# Patient Record
Sex: Female | Born: 1990 | Hispanic: No | Smoking: Current every day smoker
Health system: Southern US, Community
[De-identification: ages and names within clinical notes are randomized; demographics above are authoritative.]

## PROBLEM LIST (undated history)

## (undated) ENCOUNTER — Inpatient Hospital Stay (HOSPITAL_COMMUNITY): Payer: Self-pay

## (undated) DIAGNOSIS — Z789 Other specified health status: Secondary | ICD-10-CM

## (undated) DIAGNOSIS — O99320 Drug use complicating pregnancy, unspecified trimester: Secondary | ICD-10-CM

## (undated) HISTORY — DX: Other specified health status: Z78.9

## (undated) HISTORY — PX: NO PAST SURGERIES: SHX2092

---

## 2017-07-26 NOTE — L&D Delivery Note (Signed)
Patient is a 27 y.o. now G1P1 s/p NSVD at [redacted]w[redacted]d, who was admitted for SROM @0300  on 06/19/18.  She progressed with augmentation (pitocin) to complete and pushed 2hr 66minutes to deliver.  Cord clamping delayed by several minutes then clamped by CNM and cut by FOB.  Placenta intact and spontaneous, bleeding minimal. Mom and baby stable prior to transfer to postpartum. She plans on breastfeeding and formula feeding. She is unsure of method for birth control.  Delivery Note At 1:33 AM a viable and healthy female was delivered via Vaginal, Spontaneous (Presentation: OA ).  APGAR: 8, 9; weight pending .   Placenta intact and spontaneous, bleeding minimal. 3V Cord:  with no complications:   Anesthesia:  Epidural Episiotomy: None Lacerations: None Suture Repair: None Est. Blood Loss (mL): 51  Mom to postpartum.  Baby to Couplet care / Skin to Skin.  Lajean Manes CNM  06/20/2018, 2:39 AM

## 2017-11-04 ENCOUNTER — Other Ambulatory Visit: Payer: Self-pay

## 2017-11-04 ENCOUNTER — Encounter (HOSPITAL_BASED_OUTPATIENT_CLINIC_OR_DEPARTMENT_OTHER): Payer: Self-pay

## 2017-11-04 ENCOUNTER — Emergency Department (HOSPITAL_BASED_OUTPATIENT_CLINIC_OR_DEPARTMENT_OTHER): Payer: Medicaid Other

## 2017-11-04 ENCOUNTER — Emergency Department (HOSPITAL_BASED_OUTPATIENT_CLINIC_OR_DEPARTMENT_OTHER)
Admission: EM | Admit: 2017-11-04 | Discharge: 2017-11-04 | Disposition: A | Discharge status: Home / Self Care | Payer: Medicaid Other | Attending: Emergency Medicine | Admitting: Emergency Medicine

## 2017-11-04 DIAGNOSIS — Z5321 Procedure and treatment not carried out due to patient leaving prior to being seen by health care provider: Secondary | ICD-10-CM | POA: Insufficient documentation

## 2017-11-04 DIAGNOSIS — N939 Abnormal uterine and vaginal bleeding, unspecified: Secondary | ICD-10-CM | POA: Diagnosis present

## 2017-11-04 LAB — URINALYSIS, ROUTINE W REFLEX MICROSCOPIC
BILIRUBIN URINE: NEGATIVE
Glucose, UA: NEGATIVE mg/dL
KETONES UR: 15 mg/dL — AB
LEUKOCYTES UA: NEGATIVE
NITRITE: NEGATIVE
Protein, ur: NEGATIVE mg/dL
Specific Gravity, Urine: >1.03 — ABNORMAL HIGH (ref 1.005–1.030)
pH: 6 (ref 5.0–8.0)

## 2017-11-04 LAB — PREGNANCY, URINE: PREG TEST UR: POSITIVE — AB

## 2017-11-04 LAB — URINALYSIS, MICROSCOPIC (REFLEX)

## 2017-11-04 NOTE — ED Notes (Addendum)
Pt just  returned from [place] , pts sister states pt  is not staying as patient is walking down hallway to exit.  Pt would not stop to speak with nurse

## 2017-11-04 NOTE — ED Notes (Signed)
Patient transported to Ultrasound 

## 2017-11-04 NOTE — ED Provider Notes (Signed)
Patient presented for vaginal bleeding in early pregnancy.  I ordered a pelvic US as soon as the patient ws roomed as the tech was leaving in 15-20 min and I wanted to make sure the patient had the most important study done. The patient had the [place], but eloped from the ER as soon as she was brought back to her room and PT my evaluation.  I had no direct contact with this patient during her ED visit. I did review her US findings and I do not see any emergent issues such as ectopic pregnancy.     Margarita Mail, PA-C 11/04/17 2358    Blanchie Dessert, MD 11/05/17 [phone]

## 2017-11-04 NOTE — ED Triage Notes (Signed)
C/o vaginal bleeding since 4pm-1 pad-states she had pos preg test at [facility] in [place] she is approx [redacted] weeks pregnant-NAD-steady gait

## 2017-11-24 ENCOUNTER — Other Ambulatory Visit (HOSPITAL_COMMUNITY): Payer: Self-pay | Admitting: Family

## 2017-11-24 DIAGNOSIS — Z369 Encounter for antenatal screening, unspecified: Secondary | ICD-10-CM

## 2017-11-24 LAB — OB RESULTS CONSOLE HEPATITIS B SURFACE ANTIGEN: HEP B S AG: NEGATIVE

## 2017-11-24 LAB — OB RESULTS CONSOLE ABO/RH: RH TYPE: POSITIVE

## 2017-11-24 LAB — OB RESULTS CONSOLE RPR: RPR: NONREACTIVE

## 2017-11-24 LAB — OB RESULTS CONSOLE HGB/HCT, BLOOD
HEMATOCRIT: 36
Hemoglobin: 11.7

## 2017-11-24 LAB — OB RESULTS CONSOLE RUBELLA ANTIBODY, IGM: Rubella: IMMUNE

## 2017-11-24 LAB — OB RESULTS CONSOLE GC/CHLAMYDIA: CHLAMYDIA, DNA PROBE: POSITIVE

## 2017-11-24 LAB — OB RESULTS CONSOLE VARICELLA ZOSTER ANTIBODY, IGG: Varicella: IMMUNE

## 2017-11-24 LAB — OB RESULTS CONSOLE PLATELET COUNT: Platelets: 286

## 2017-11-28 ENCOUNTER — Other Ambulatory Visit: Payer: Self-pay | Admitting: Family

## 2017-11-28 DIAGNOSIS — N6320 Unspecified lump in the left breast, unspecified quadrant: Secondary | ICD-10-CM

## 2017-12-08 ENCOUNTER — Encounter: Payer: Self-pay | Admitting: *Deleted

## 2017-12-13 ENCOUNTER — Encounter: Payer: Self-pay | Admitting: Obstetrics and Gynecology

## 2017-12-13 ENCOUNTER — Other Ambulatory Visit (HOSPITAL_COMMUNITY): Payer: Self-pay | Admitting: Maternal and Fetal Medicine

## 2017-12-14 ENCOUNTER — Other Ambulatory Visit: Payer: Self-pay

## 2017-12-16 ENCOUNTER — Encounter (HOSPITAL_COMMUNITY): Payer: Self-pay

## 2017-12-21 ENCOUNTER — Encounter (HOSPITAL_COMMUNITY): Payer: Self-pay | Admitting: *Deleted

## 2017-12-22 ENCOUNTER — Other Ambulatory Visit (HOSPITAL_COMMUNITY): Payer: Self-pay | Admitting: Family

## 2017-12-22 DIAGNOSIS — Z369 Encounter for antenatal screening, unspecified: Secondary | ICD-10-CM

## 2017-12-22 DIAGNOSIS — Z3A13 13 weeks gestation of pregnancy: Secondary | ICD-10-CM

## 2017-12-22 DIAGNOSIS — Z3682 Encounter for antenatal screening for nuchal translucency: Secondary | ICD-10-CM

## 2017-12-23 ENCOUNTER — Ambulatory Visit (HOSPITAL_COMMUNITY)
Admission: RE | Admit: 2017-12-23 | Discharge: 2017-12-23 | Disposition: A | Discharge status: Home / Self Care | Payer: Medicaid Other | Source: Ambulatory Visit | Attending: Family | Admitting: Family

## 2017-12-23 ENCOUNTER — Encounter (HOSPITAL_COMMUNITY): Payer: Self-pay

## 2017-12-23 ENCOUNTER — Other Ambulatory Visit: Payer: Self-pay

## 2017-12-23 DIAGNOSIS — Z3A13 13 weeks gestation of pregnancy: Secondary | ICD-10-CM | POA: Insufficient documentation

## 2017-12-23 DIAGNOSIS — Z3682 Encounter for antenatal screening for nuchal translucency: Secondary | ICD-10-CM | POA: Diagnosis present

## 2017-12-23 DIAGNOSIS — Z369 Encounter for antenatal screening, unspecified: Secondary | ICD-10-CM | POA: Insufficient documentation

## 2017-12-23 HISTORY — DX: Drug use complicating pregnancy, unspecified trimester: O99.320

## 2018-01-03 DIAGNOSIS — Z3480 Encounter for supervision of other normal pregnancy, unspecified trimester: Secondary | ICD-10-CM | POA: Insufficient documentation

## 2018-01-12 ENCOUNTER — Other Ambulatory Visit: Payer: Self-pay

## 2018-02-22 NOTE — BH Specialist Note (Deleted)
Integrated Behavioral Health Initial Visit  MRN: [id number] Name: April Boyd  Number of [place] Clinician visits:: 1/6 Session Start time: ***  Session End time: *** Total time: {IBH Total Time:21014050}  Type of Service: [place] Interpretor:No. Interpretor Name and Language: n/a   Warm Hand Off Completed.       SUBJECTIVE: April Boyd is a 27 y.o. female accompanied by {CHL AMB ACCOMPANIED WI:0973532992} Patient was referred by Arlina Robes, MD for initial OB introduction to integrated behavior health services. Patient reports the following symptoms/concerns: *** Duration of problem: ***; Severity of problem: {Mild/Moderate/Severe:20260}  OBJECTIVE: Mood: {BHH MOOD:22306} and Affect: {BHH AFFECT:22307} Risk of harm to self or others: {CHL AMB BH Suicide Current Mental Status:21022748}  LIFE CONTEXT: Family and Social: *** School/Work: *** Self-Care: ***methadone/heroin Life Changes: Current pregnancy ***  GOALS ADDRESSED: Patient will: 1. Reduce symptoms of: {IBH Symptoms:21014056} 2. Increase knowledge and/or ability of: {IBH Patient Tools:21014057}  3. Demonstrate ability to: {IBH Goals:21014053}  INTERVENTIONS: Interventions utilized: {IBH Interventions:21014054}  Standardized Assessments completed: GAD-7 and PHQ 9  ASSESSMENT: Patient currently experiencing Supervision of *** pregnancy.   Patient may benefit from initial OB introduction to integrated behavior health services ***.  PLAN: 1. Follow up with behavioral health clinician on : *** 2. Behavioral recommendations: *** 3. Referral(s): {IBH Referrals:21014055} 4. "From scale of 1-10, how likely are you to follow plan?": ***  Caroleen Hamman Haila Dena, LCSW

## 2018-02-23 ENCOUNTER — Encounter: Payer: Self-pay | Admitting: Obstetrics and Gynecology

## 2018-02-23 ENCOUNTER — Ambulatory Visit (INDEPENDENT_AMBULATORY_CARE_PROVIDER_SITE_OTHER): Payer: Medicaid Other | Admitting: Obstetrics and Gynecology

## 2018-02-23 ENCOUNTER — Other Ambulatory Visit (HOSPITAL_COMMUNITY)
Admission: RE | Admit: 2018-02-23 | Discharge: 2018-02-23 | Disposition: A | Discharge status: Home / Self Care | Payer: Medicaid Other | Source: Ambulatory Visit | Attending: Obstetrics and Gynecology | Admitting: Obstetrics and Gynecology

## 2018-02-23 VITALS — BP 119/81 | HR 99 | Wt 131.7 lb

## 2018-02-23 DIAGNOSIS — O99320 Drug use complicating pregnancy, unspecified trimester: Secondary | ICD-10-CM

## 2018-02-23 DIAGNOSIS — Z3480 Encounter for supervision of other normal pregnancy, unspecified trimester: Secondary | ICD-10-CM

## 2018-02-23 DIAGNOSIS — O98811 Other maternal infectious and parasitic diseases complicating pregnancy, first trimester: Secondary | ICD-10-CM | POA: Diagnosis not present

## 2018-02-23 DIAGNOSIS — O99322 Drug use complicating pregnancy, second trimester: Secondary | ICD-10-CM | POA: Diagnosis not present

## 2018-02-23 DIAGNOSIS — O98812 Other maternal infectious and parasitic diseases complicating pregnancy, second trimester: Secondary | ICD-10-CM

## 2018-02-23 DIAGNOSIS — N63 Unspecified lump in unspecified breast: Secondary | ICD-10-CM | POA: Insufficient documentation

## 2018-02-23 DIAGNOSIS — A749 Chlamydial infection, unspecified: Secondary | ICD-10-CM | POA: Insufficient documentation

## 2018-02-23 DIAGNOSIS — Z3A22 22 weeks gestation of pregnancy: Secondary | ICD-10-CM | POA: Diagnosis not present

## 2018-02-23 DIAGNOSIS — Z91410 Personal history of adult physical and sexual abuse: Secondary | ICD-10-CM | POA: Insufficient documentation

## 2018-02-23 DIAGNOSIS — F1910 Other psychoactive substance abuse, uncomplicated: Secondary | ICD-10-CM | POA: Insufficient documentation

## 2018-02-23 DIAGNOSIS — O98819 Other maternal infectious and parasitic diseases complicating pregnancy, unspecified trimester: Secondary | ICD-10-CM

## 2018-02-23 NOTE — Patient Instructions (Signed)
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
## 2018-02-24 LAB — POCT URINALYSIS DIP (DEVICE)
BILIRUBIN URINE: NEGATIVE
Glucose, UA: NEGATIVE mg/dL
KETONES UR: NEGATIVE mg/dL
LEUKOCYTES UA: NEGATIVE
NITRITE: NEGATIVE
PH: 6 (ref 5.0–8.0)
Protein, ur: NEGATIVE mg/dL
Specific Gravity, Urine: 1.025 (ref 1.005–1.030)
Urobilinogen, UA: 0.2 mg/dL (ref 0.0–1.0)

## 2018-02-27 LAB — URINE CYTOLOGY ANCILLARY ONLY
CHLAMYDIA, DNA PROBE: NEGATIVE
Neisseria Gonorrhea: NEGATIVE

## 2018-03-03 ENCOUNTER — Ambulatory Visit (HOSPITAL_COMMUNITY)
Admission: RE | Admit: 2018-03-03 | Discharge: 2018-03-03 | Disposition: A | Discharge status: Home / Self Care | Payer: Medicaid Other | Source: Ambulatory Visit | Attending: Obstetrics and Gynecology | Admitting: Obstetrics and Gynecology

## 2018-03-03 ENCOUNTER — Encounter (HOSPITAL_COMMUNITY): Payer: Self-pay

## 2018-03-03 DIAGNOSIS — F1110 Opioid abuse, uncomplicated: Secondary | ICD-10-CM | POA: Diagnosis not present

## 2018-03-03 DIAGNOSIS — Z3480 Encounter for supervision of other normal pregnancy, unspecified trimester: Secondary | ICD-10-CM

## 2018-03-03 DIAGNOSIS — Z3A23 23 weeks gestation of pregnancy: Secondary | ICD-10-CM

## 2018-03-03 DIAGNOSIS — O99322 Drug use complicating pregnancy, second trimester: Secondary | ICD-10-CM | POA: Insufficient documentation

## 2018-03-03 DIAGNOSIS — F1910 Other psychoactive substance abuse, uncomplicated: Secondary | ICD-10-CM

## 2018-03-03 DIAGNOSIS — Z363 Encounter for antenatal screening for malformations: Secondary | ICD-10-CM | POA: Diagnosis not present

## 2018-03-03 DIAGNOSIS — O99320 Drug use complicating pregnancy, unspecified trimester: Secondary | ICD-10-CM | POA: Diagnosis not present

## 2018-03-03 DIAGNOSIS — F1190 Opioid use, unspecified, uncomplicated: Secondary | ICD-10-CM | POA: Insufficient documentation

## 2018-03-06 ENCOUNTER — Other Ambulatory Visit (HOSPITAL_COMMUNITY): Payer: Self-pay | Admitting: *Deleted

## 2018-03-06 DIAGNOSIS — O99320 Drug use complicating pregnancy, unspecified trimester: Principal | ICD-10-CM

## 2018-03-06 DIAGNOSIS — F1120 Opioid dependence, uncomplicated: Secondary | ICD-10-CM

## 2018-03-17 ENCOUNTER — Other Ambulatory Visit: Payer: Self-pay

## 2018-03-17 DIAGNOSIS — Z3480 Encounter for supervision of other normal pregnancy, unspecified trimester: Secondary | ICD-10-CM

## 2018-03-17 NOTE — BH Specialist Note (Signed)
Integrated Behavioral Health Initial Visit  MRN: [id number] Name: April Boyd  Number of [place] Clinician visits:: 1/6 Session Start time: 9:20 Session End time: 9:31 Total time: 10 minutes  Type of Service: [place] Interpretor:No. Interpretor Name and Language: n/a   Warm Hand Off Completed.       SUBJECTIVE: April Boyd is a 27 y.o. female accompanied by n/a Patient was referred by Emeterio Reeve, MD for initial OB introduction to integrated behavioral health services Patient reports the following symptoms/concerns: Pt states her primary concern today is feeling nauseous with 2hrGTT; self reports no alcohol or substance use since positive pregnancy.  Duration of problem: Today; Severity of problem: mild  OBJECTIVE: Mood: Appropriate and Affect: Appropriate Risk of harm to self or others: No plan to harm self or others  LIFE CONTEXT: Family and Social: - School/Work: - Self-Care: Likes to go out with friends  Life Changes: Current pregnancy  GOALS ADDRESSED: 1. Increase knowledge and/or ability of: healthy habits  2. Demonstrate ability to: Increase healthy adjustment to current life circumstances  INTERVENTIONS: Interventions utilized: Supportive Counseling  Standardized Assessments completed: GAD-7 and PHQ 9  ASSESSMENT: Patient currently experiencing Supervision of other normal pregnancy, antepartum   Patient may benefit from initial OB introduction to integrated behavioral health services.  Marland Kitchen  PLAN: 1. Follow up with behavioral health clinician on : One month, or at next medical appointment 2. Behavioral recommendations:  -Read educational materials regarding coping with symptoms of depression and anxiety  3. Referral(s): [place] [facility]) 4. "From scale of 1-10, how likely are you to follow plan?": -  Garlan Fair, LCSW    Depression screen [facility] 2/9  03/20/2018 02/23/2018  Decreased Interest 0 0  Down, Depressed, Hopeless 0 0  PHQ - 2 Score 0 0  Altered sleeping 0 0  Tired, decreased energy 1 0  Change in appetite 1 0  Feeling bad or failure about yourself  2 0  Trouble concentrating 0 0  Moving slowly or fidgety/restless 1 0  Suicidal thoughts 0 0  PHQ-9 Score 5 0   GAD 7 : Generalized Anxiety Score 03/20/2018 02/23/2018  Nervous, Anxious, on Edge 2 0  Control/stop worrying 1 0  Worry too much - different things 1 0  Trouble relaxing 1 0  Restless 0 0  Easily annoyed or irritable 1 0  Afraid - awful might happen 1 0  Total GAD 7 Score 7 0

## 2018-03-20 ENCOUNTER — Encounter: Payer: Self-pay | Admitting: Obstetrics & Gynecology

## 2018-03-20 ENCOUNTER — Ambulatory Visit: Payer: Medicaid Other | Admitting: Clinical

## 2018-03-20 ENCOUNTER — Other Ambulatory Visit: Payer: Medicaid Other

## 2018-03-20 DIAGNOSIS — Z3480 Encounter for supervision of other normal pregnancy, unspecified trimester: Secondary | ICD-10-CM

## 2018-03-20 NOTE — Progress Notes (Signed)
error 

## 2018-03-28 LAB — GLUCOSE TOLERANCE, 2 HOURS W/ 1HR
GLUCOSE, 1 HOUR: 85 mg/dL (ref 65–179)
GLUCOSE, FASTING: 76 mg/dL (ref 65–91)

## 2018-03-28 LAB — CBC
HEMOGLOBIN: 10.8 g/dL — AB (ref 11.1–15.9)
Hematocrit: 32.6 % — ABNORMAL LOW (ref 34.0–46.6)
MCH: 28.3 pg (ref 26.6–33.0)
MCHC: 33.1 g/dL (ref 31.5–35.7)
MCV: 86 fL (ref 79–97)
PLATELETS: 262 10*3/uL (ref 150–450)
RBC: 3.81 x10E6/uL (ref 3.77–5.28)
RDW: 12.9 % (ref 12.3–15.4)
WBC: 8.6 10*3/uL (ref 3.4–10.8)

## 2018-03-28 LAB — RPR: RPR: NONREACTIVE

## 2018-03-28 LAB — HIV ANTIBODY (ROUTINE TESTING W REFLEX): HIV Screen 4th Generation wRfx: NONREACTIVE

## 2018-03-30 ENCOUNTER — Ambulatory Visit (INDEPENDENT_AMBULATORY_CARE_PROVIDER_SITE_OTHER): Payer: Medicaid Other | Admitting: Obstetrics and Gynecology

## 2018-03-30 ENCOUNTER — Other Ambulatory Visit: Payer: Self-pay | Admitting: Obstetrics and Gynecology

## 2018-03-30 ENCOUNTER — Encounter: Payer: Self-pay | Admitting: Obstetrics and Gynecology

## 2018-03-30 VITALS — BP 122/75 | HR 76 | Wt 135.0 lb

## 2018-03-30 DIAGNOSIS — F1910 Other psychoactive substance abuse, uncomplicated: Secondary | ICD-10-CM | POA: Diagnosis not present

## 2018-03-30 DIAGNOSIS — O99320 Drug use complicating pregnancy, unspecified trimester: Secondary | ICD-10-CM | POA: Diagnosis not present

## 2018-03-30 DIAGNOSIS — N63 Unspecified lump in unspecified breast: Secondary | ICD-10-CM | POA: Diagnosis not present

## 2018-03-30 DIAGNOSIS — N6320 Unspecified lump in the left breast, unspecified quadrant: Secondary | ICD-10-CM

## 2018-03-30 DIAGNOSIS — Z3483 Encounter for supervision of other normal pregnancy, third trimester: Secondary | ICD-10-CM

## 2018-03-30 DIAGNOSIS — Z3480 Encounter for supervision of other normal pregnancy, unspecified trimester: Secondary | ICD-10-CM
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
## 2018-03-30 NOTE — Progress Notes (Signed)
Discussed will schedule [facility] tour and they will call her with appt. Also scheduled fro breast US.

## 2018-03-30 NOTE — Patient Instructions (Signed)
Third Trimester of Pregnancy The third trimester is from week 28 through week 40 (months 7 through 9). The third trimester is a time when the unborn baby (fetus) is growing rapidly. At the end of the ninth month, the fetus is about 20 inches in length and weighs 6-10 pounds. Body changes during your third trimester Your body will continue to go through many changes during pregnancy. The changes vary from woman to woman. During the third trimester:  Your weight will continue to increase. You can expect to gain 25-35 pounds (11-16 kg) by the end of the pregnancy.  You may begin to get stretch marks on your hips, abdomen, and breasts.  You may urinate more often because the fetus is moving lower into your pelvis and pressing on your bladder.  You may develop or continue to have heartburn. This is caused by increased hormones that slow down muscles in the digestive tract.  You may develop or continue to have constipation because increased hormones slow digestion and cause the muscles that push waste through your intestines to relax.  You may develop hemorrhoids. These are swollen veins (varicose veins) in the rectum that can itch or be painful.  You may develop swollen, bulging veins (varicose veins) in your legs.  You may have increased body aches in the pelvis, back, or thighs. This is due to weight gain and increased hormones that are relaxing your joints.  You may have changes in your hair. These can include thickening of your hair, rapid growth, and changes in texture. Some women also have hair loss during or after pregnancy, or hair that feels dry or thin. Your hair will most likely return to normal after your baby is born.  Your breasts will continue to grow and they will continue to become tender. A yellow fluid (colostrum) may leak from your breasts. This is the first milk you are producing for your baby.  Your belly button may stick out.  You may notice more swelling in your hands,  face, or ankles.  You may have increased tingling or numbness in your hands, arms, and legs. The skin on your belly may also feel numb.  You may feel short of breath because of your expanding uterus.  You may have more problems sleeping. This can be caused by the size of your belly, increased need to urinate, and an increase in your body's metabolism.  You may notice the fetus "dropping," or moving lower in your abdomen (lightening).  You may have increased vaginal discharge.  You may notice your joints feel loose and you may have pain around your pelvic bone.  What to expect at prenatal visits You will have prenatal exams every 2 weeks until week 36. Then you will have weekly prenatal exams. During a routine prenatal visit:  You will be weighed to make sure you and the baby are growing normally.  Your blood pressure will be taken.  Your abdomen will be measured to track your baby's growth.  The fetal heartbeat will be listened to.  Any test results from the previous visit will be discussed.  You may have a cervical check near your due date to see if your cervix has softened or thinned (effaced).  You will be tested for Group B streptococcus. This happens between 35 and 37 weeks.  Your health care provider may ask you:  What your birth plan is.  How you are feeling.  If you are feeling the baby move.  If you have had   any abnormal symptoms, such as leaking fluid, bleeding, severe headaches, or abdominal cramping.  If you are using any tobacco products, including cigarettes, chewing tobacco, and electronic cigarettes.  If you have any questions.  Other tests or screenings that may be performed during your third trimester include:  Blood tests that check for low iron levels (anemia).  Fetal testing to check the health, activity level, and growth of the fetus. Testing is done if you have certain medical conditions or if there are problems during the  pregnancy.  Nonstress test (NST). This test checks the health of your baby to make sure there are no signs of problems, such as the baby not getting enough oxygen. During this test, a belt is placed around your belly. The baby is made to move, and its heart rate is monitored during movement.  What is false labor? False labor is a condition in which you feel small, irregular tightenings of the muscles in the womb (contractions) that usually go away with rest, changing position, or drinking water. These are called Braxton Hicks contractions. Contractions may last for hours, days, or even weeks before true labor sets in. If contractions come at regular intervals, become more frequent, increase in intensity, or become painful, you should see your health care provider. What are the signs of labor?  Abdominal cramps.  Regular contractions that start at 10 minutes apart and become stronger and more frequent with time.  Contractions that start on the top of the uterus and spread down to the lower abdomen and back.  Increased pelvic pressure and dull back pain.  A watery or bloody mucus discharge that comes from the vagina.  Leaking of amniotic fluid. This is also known as your "water breaking." It could be a slow trickle or a gush. Let your health care provider know if it has a color or strange odor. If you have any of these signs, call your health care provider right away, even if it is before your due date. Follow these instructions at home: Medicines  Follow your health care provider's instructions regarding medicine use. Specific medicines may be either safe or unsafe to take during pregnancy.  Take a prenatal vitamin that contains at least 600 micrograms (mcg) of folic acid.  If you develop constipation, try taking a stool softener if your health care provider approves. Eating and drinking  Eat a balanced diet that includes fresh fruits and vegetables, whole grains, good sources of protein  such as meat, eggs, or tofu, and low-fat dairy. Your health care provider will help you determine the amount of weight gain that is right for you.  Avoid raw meat and uncooked cheese. These carry germs that can cause birth defects in the baby.  If you have low calcium intake from food, talk to your health care provider about whether you should take a daily calcium supplement.  Eat four or five small meals rather than three large meals a day.  Limit foods that are high in fat and processed sugars, such as fried and sweet foods.  To prevent constipation: ? Drink enough fluid to keep your urine clear or pale yellow. ? Eat foods that are high in fiber, such as fresh fruits and vegetables, whole grains, and beans. Activity  Exercise only as directed by your health care provider. Most women can continue their usual exercise routine during pregnancy. Try to exercise for 30 minutes at least 5 days a week. Stop exercising if you experience uterine contractions.  Avoid heavy   lifting.  Do not exercise in extreme heat or humidity, or at high altitudes.  Wear low-heel, comfortable shoes.  Practice good posture.  You may continue to have sex unless your health care provider tells you otherwise. Relieving pain and discomfort  Take frequent breaks and rest with your legs elevated if you have leg cramps or low back pain.  Take warm sitz baths to soothe any pain or discomfort caused by hemorrhoids. Use hemorrhoid cream if your health care provider approves.  Wear a good support bra to prevent discomfort from breast tenderness.  If you develop varicose veins: ? Wear support pantyhose or compression stockings as told by your healthcare provider. ? Elevate your feet for 15 minutes, 3-4 times a day. Prenatal care  Write down your questions. Take them to your prenatal visits.  Keep all your prenatal visits as told by your health care provider. This is important. Safety  Wear your seat belt at  all times when driving.  Make a list of emergency phone numbers, including numbers for family, friends, the hospital, and police and fire departments. General instructions  Avoid cat litter boxes and soil used by cats. These carry germs that can cause birth defects in the baby. If you have a cat, ask someone to clean the litter box for you.  Do not travel far distances unless it is absolutely necessary and only with the approval of your health care provider.  Do not use hot tubs, steam rooms, or saunas.  Do not drink alcohol.  Do not use any products that contain nicotine or tobacco, such as cigarettes and e-cigarettes. If you need help quitting, ask your health care provider.  Do not use any medicinal herbs or unprescribed drugs. These chemicals affect the formation and growth of the baby.  Do not douche or use tampons or scented sanitary pads.  Do not cross your legs for long periods of time.  To prepare for the arrival of your baby: ? Take prenatal classes to understand, practice, and ask questions about labor and delivery. ? Make a trial run to the hospital. ? Visit the hospital and tour the maternity area. ? Arrange for maternity or paternity leave through employers. ? Arrange for family and friends to take care of pets while you are in the hospital. ? Purchase a rear-facing car seat and make sure you know how to install it in your car. ? Pack your hospital bag. ? Prepare the baby's nursery. Make sure to remove all pillows and stuffed animals from the baby's crib to prevent suffocation.  Visit your dentist if you have not gone during your pregnancy. Use a soft toothbrush to brush your teeth and be gentle when you floss. Contact a health care provider if:  You are unsure if you are in labor or if your water has broken.  You become dizzy.  You have mild pelvic cramps, pelvic pressure, or nagging pain in your abdominal area.  You have lower back pain.  You have persistent  nausea, vomiting, or diarrhea.  You have an unusual or bad smelling vaginal discharge.  You have pain when you urinate. Get help right away if:  Your water breaks before 37 weeks.  You have regular contractions less than 5 minutes apart before 37 weeks.  You have a fever.  You are leaking fluid from your vagina.  You have spotting or bleeding from your vagina.  You have severe abdominal pain or cramping.  You have rapid weight loss or weight gain.    You have shortness of breath with chest pain.  You notice sudden or extreme swelling of your face, hands, ankles, feet, or legs.  Your baby makes fewer than 10 movements in 2 hours.  You have severe headaches that do not go away when you take medicine.  You have vision changes. Summary  The third trimester is from week 28 through week 40, months 7 through 9. The third trimester is a time when the unborn baby (fetus) is growing rapidly.  During the third trimester, your discomfort may increase as you and your baby continue to gain weight. You may have abdominal, leg, and back pain, sleeping problems, and an increased need to urinate.  During the third trimester your breasts will keep growing and they will continue to become tender. A yellow fluid (colostrum) may leak from your breasts. This is the first milk you are producing for your baby.  False labor is a condition in which you feel small, irregular tightenings of the muscles in the womb (contractions) that eventually go away. These are called Braxton Hicks contractions. Contractions may last for hours, days, or even weeks before true labor sets in.  Signs of labor can include: abdominal cramps; regular contractions that start at 10 minutes apart and become stronger and more frequent with time; watery or bloody mucus discharge that comes from the vagina; increased pelvic pressure and dull back pain; and leaking of amniotic fluid. This information is not intended to replace advice  given to you by your health care provider. Make sure you discuss any questions you have with your health care provider. Document Released: 07/06/2001 Document Revised: 12/18/2015 Document Reviewed: 09/12/2012 Elsevier Interactive Patient Education  2017 [employer]  

## 2018-03-31 ENCOUNTER — Other Ambulatory Visit: Payer: Self-pay | Admitting: Obstetrics and Gynecology

## 2018-03-31 ENCOUNTER — Emergency Department (HOSPITAL_BASED_OUTPATIENT_CLINIC_OR_DEPARTMENT_OTHER)
Admission: EM | Admit: 2018-03-31 | Discharge: 2018-03-31 | Discharge status: Against Medical Advice | Payer: Medicaid Other | Attending: Emergency Medicine | Admitting: Emergency Medicine

## 2018-03-31 ENCOUNTER — Encounter (HOSPITAL_BASED_OUTPATIENT_CLINIC_OR_DEPARTMENT_OTHER): Payer: Self-pay

## 2018-03-31 ENCOUNTER — Other Ambulatory Visit: Payer: Self-pay

## 2018-03-31 DIAGNOSIS — O99332 Smoking (tobacco) complicating pregnancy, second trimester: Secondary | ICD-10-CM | POA: Insufficient documentation

## 2018-03-31 DIAGNOSIS — Z79899 Other long term (current) drug therapy: Secondary | ICD-10-CM | POA: Diagnosis not present

## 2018-03-31 DIAGNOSIS — O479 False labor, unspecified: Secondary | ICD-10-CM

## 2018-03-31 DIAGNOSIS — F17200 Nicotine dependence, unspecified, uncomplicated: Secondary | ICD-10-CM | POA: Diagnosis not present

## 2018-03-31 DIAGNOSIS — W19XXXA Unspecified fall, initial encounter: Secondary | ICD-10-CM

## 2018-03-31 DIAGNOSIS — W0110XA Fall on same level from slipping, tripping and stumbling with subsequent striking against unspecified object, initial encounter: Secondary | ICD-10-CM | POA: Insufficient documentation

## 2018-03-31 DIAGNOSIS — F1190 Opioid use, unspecified, uncomplicated: Secondary | ICD-10-CM | POA: Insufficient documentation

## 2018-03-31 DIAGNOSIS — O99322 Drug use complicating pregnancy, second trimester: Secondary | ICD-10-CM | POA: Insufficient documentation

## 2018-03-31 DIAGNOSIS — Z3A27 27 weeks gestation of pregnancy: Secondary | ICD-10-CM | POA: Diagnosis not present

## 2018-03-31 DIAGNOSIS — O9989 Other specified diseases and conditions complicating pregnancy, childbirth and the puerperium: Secondary | ICD-10-CM | POA: Diagnosis present

## 2018-03-31 MED ORDER — SODIUM CHLORIDE 0.9 % IV BOLUS
1000.0000 mL | Freq: Once | INTRAVENOUS | Status: AC
  Administered 2018-03-31: 1000 mL via INTRAVENOUS
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
## 2018-03-31 NOTE — ED Notes (Signed)
Patient requesting to go home; patient refusing transport via [facility] to [facility] hospital; patient states she would like to drive there herself; Discussed with patient the importance to monitor her for contractions; patient verbalized understanding of instructions.
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
## 2018-03-31 NOTE — ED Notes (Signed)
[facility] notified (Tam[facility]) - [facility] needed [facility] - Dr. Elly Modena accepting

## 2018-03-31 NOTE — Progress Notes (Signed)
Call to Dr Elly Modena, updated on patient in [facility], updated on patient status and EFM tracing.  Given phone number to [facility] for provider to provider call.

## 2018-03-31 NOTE — Progress Notes (Signed)
EFM tracing removed pending discharge

## 2018-03-31 NOTE — Progress Notes (Signed)
Call from [facility] ED for patient [redacted]weeks gestation, G1P0, that [place] at work and now reporting lower abd pain and L side body pain.  Patient denies vaginal bleeding or LOF. Patient placed on EFM by ED nurse and will follow remotely.

## 2018-04-01 ENCOUNTER — Encounter (HOSPITAL_COMMUNITY): Payer: Self-pay | Admitting: *Deleted

## 2018-04-01 ENCOUNTER — Inpatient Hospital Stay (HOSPITAL_COMMUNITY)
Admission: AD | Admit: 2018-04-01 | Discharge: 2018-04-01 | Disposition: A | Discharge status: Home / Self Care | Payer: Medicaid Other | Source: Ambulatory Visit | Attending: Obstetrics and Gynecology | Admitting: Obstetrics and Gynecology

## 2018-04-01 DIAGNOSIS — F17210 Nicotine dependence, cigarettes, uncomplicated: Secondary | ICD-10-CM | POA: Diagnosis not present

## 2018-04-01 DIAGNOSIS — W010XXA Fall on same level from slipping, tripping and stumbling without subsequent striking against object, initial encounter: Secondary | ICD-10-CM | POA: Diagnosis not present

## 2018-04-01 DIAGNOSIS — O99332 Smoking (tobacco) complicating pregnancy, second trimester: Secondary | ICD-10-CM | POA: Diagnosis not present

## 2018-04-01 DIAGNOSIS — Y9301 Activity, walking, marching and hiking: Secondary | ICD-10-CM | POA: Insufficient documentation

## 2018-04-01 DIAGNOSIS — Z8249 Family history of ischemic heart disease and other diseases of the circulatory system: Secondary | ICD-10-CM | POA: Insufficient documentation

## 2018-04-01 DIAGNOSIS — Y9289 Other specified places as the place of occurrence of the external cause: Secondary | ICD-10-CM | POA: Insufficient documentation

## 2018-04-01 DIAGNOSIS — W010XXD Fall on same level from slipping, tripping and stumbling without subsequent striking against object, subsequent encounter: Secondary | ICD-10-CM

## 2018-04-01 DIAGNOSIS — Z79891 Long term (current) use of opiate analgesic: Secondary | ICD-10-CM | POA: Insufficient documentation

## 2018-04-01 DIAGNOSIS — Z3A27 27 weeks gestation of pregnancy: Secondary | ICD-10-CM | POA: Insufficient documentation

## 2018-04-01 DIAGNOSIS — O9A212 Injury, poisoning and certain other consequences of external causes complicating pregnancy, second trimester: Secondary | ICD-10-CM | POA: Diagnosis present

## 2018-04-01 DIAGNOSIS — Z3480 Encounter for supervision of other normal pregnancy, unspecified trimester: Secondary | ICD-10-CM

## 2018-04-01 DIAGNOSIS — W19XXXA Unspecified fall, initial encounter: Secondary | ICD-10-CM

## 2018-04-01 LAB — URINALYSIS, ROUTINE W REFLEX MICROSCOPIC
Bilirubin Urine: NEGATIVE
GLUCOSE, UA: NEGATIVE mg/dL
HGB URINE DIPSTICK: NEGATIVE
KETONES UR: NEGATIVE mg/dL
NITRITE: NEGATIVE
PROTEIN: NEGATIVE mg/dL
Specific Gravity, Urine: 1.01 (ref 1.005–1.030)
pH: 8 (ref 5.0–8.0)

## 2018-04-01 NOTE — Discharge Instructions (Signed)
Preventing Injuries During Pregnancy Injuries can happen during pregnancy. Minor falls and accidents usually do not harm you or your baby. But some injuries can harm you and your baby. Tell your doctor about any injury you suffer. What can I do to avoid injuries? Safety  Remove rugs and loose objects on the floor.  Wear comfortable shoes that have a good grip. Do not wear shoes that have high heels.  Always wear your seat belt in the car. The lap belt should be below your belly. Always drive safely.  Do not ride on a motorcycle. Activity  Do not take part in rough and violent activities or sports.  Avoid: ? Walking on wet or slippery floors. ? Lifting heavy pots of boiling or hot liquids. ? Fixing electrical problems. ? Being near fires. General instructions  Take over-the-counter and prescription medicines only as told by your doctor.  Know your blood type and the blood type of the baby's father.  If you are a victim of domestic violence: ? Call your local emergency services (911 in the [place] ? Contact the [employer] Violence Hotline for help and support. Get help right away if:  You fall on your belly or receive any serious blow to your belly.  You have a stiff neck or neck pain after a fall or an injury.  You get a headache or have problems with vision after an injury.  You do not feel the baby move or the baby is not moving as much as normal.  You have been a victim of domestic violence or any other kind of attack.  You have been in a car accident.  You have bleeding from your vagina.  Fluid is leaking from your vagina.  You start to have cramping or pain in your belly (contractions).  You have very bad pain in your lower back.  You feel weak or pass out (faint).  You start to throw up (vomit) after an injury.  You have been burned. Summary  Some injuries that happen during pregnancy can do harm to the baby.  Tell your doctor about any  injury.  Take steps to avoid injury. This includes removing rugs and loose objects on the floor. Always wear your seat belt in the car.  Do not take part in rough and violent activities or sports.  Get help right away if you have any serious accident or injury. This information is not intended to replace advice given to you by your health care provider. Make sure you discuss any questions you have with your health care provider. Document Released: 08/14/2010 Document Revised: 07/21/2016 Document Reviewed: 07/21/2016 Elsevier Interactive Patient Education  2017 [employer]. Fetal Movement Counts Patient Name: ________________________________________________ Patient Due Date: ____________________ What is a fetal movement count? A fetal movement count is the number of times that you feel your baby move during a certain amount of time. This may also be called a fetal kick count. A fetal movement count is recommended for every pregnant woman. You may be asked to start counting fetal movements as early as week 28 of your pregnancy. Pay attention to when your baby is most active. You may notice your baby's sleep and wake cycles. You may also notice things that make your baby move more. You should do a fetal movement count:  When your baby is normally most active.  At the same time each day.  A good time to count movements is while you are resting, after having something to eat  and drink. How do I count fetal movements? 1. Find a quiet, comfortable area. Sit, or lie down on your side. 2. Write down the date, the start time and stop time, and the number of movements that you felt between those two times. Take this information with you to your health care visits. 3. For 2 hours, count kicks, flutters, swishes, rolls, and jabs. You should feel at least 10 movements during 2 hours. 4. You may stop counting after you have felt 10 movements. 5. If you do not feel 10 movements in 2 hours, have something  to eat and drink. Then, keep resting and counting for 1 hour. If you feel at least 4 movements during that hour, you may stop counting. Contact a health care provider if:  You feel fewer than 4 movements in 2 hours.  Your baby is not moving like he or she usually does. Date: ____________ Start time: ____________ Stop time: ____________ Movements: ____________ Date: ____________ Start time: ____________ Stop time: ____________ Movements: ____________ Date: ____________ Start time: ____________ Stop time: ____________ Movements: ____________ Date: ____________ Start time: ____________ Stop time: ____________ Movements: ____________ Date: ____________ Start time: ____________ Stop time: ____________ Movements: ____________ Date: ____________ Start time: ____________ Stop time: ____________ Movements: ____________ Date: ____________ Start time: ____________ Stop time: ____________ Movements: ____________ Date: ____________ Start time: ____________ Stop time: ____________ Movements: ____________ Date: ____________ Start time: ____________ Stop time: ____________ Movements: ____________ This information is not intended to replace advice given to you by your health care provider. Make sure you discuss any questions you have with your health care provider. Document Released: 08/11/2006 Document Revised: 03/10/2016 Document Reviewed: 08/21/2015 Elsevier Interactive Patient Education  [employer].

## 2018-04-01 NOTE — MAU Provider Note (Signed)
History     CSN: [id number]  Arrival date and time: 04/01/18 0220   First Provider Initiated Contact with Patient 04/01/18 0316      Chief Complaint  Patient presents with  . Fall   April Boyd is a 27 y.o. G1P0000 at [redacted]w[redacted]d who presents today after a fall. She states that around 2000 she slipped on some water at work. She then landed on her back, and reports that her co-workers told her that she hit her head. She denies any LOC. She was seen at [facility] earlier and was monitored there for about an hour and half. She was having some contractions, and was given a fluid bolus. Patient then left AMA, but she eventually came here to continue monitoring. She denies feeling contractions, VB or LOF. She reports normal fetal movement.   Fall  The accident occurred 6 to 12 hours ago. The fall occurred while walking. She landed on hard floor. There was no blood loss. The point of impact was the buttocks and head. The patient is experiencing no pain. Pertinent negatives include no fever, nausea or vomiting.    OB History    Gravida  1   Para  0   Term  0   Preterm  0   AB  0   Living  0     SAB  0   TAB  0   Ectopic  0   Multiple  0   Live Births  0           Past Medical History:  Diagnosis Date  . Drug use affecting pregnancy   . Medical history non-contributory     Past Surgical History:  Procedure Laterality Date  . NO PAST SURGERIES      Family History  Problem Relation Age of Onset  . Hypertension Father     Social History   Tobacco Use  . Smoking status: Current Every Day Smoker    Packs/day: 0.25  . Smokeless tobacco: Never Used  Substance Use Topics  . Alcohol use: Not Currently  . Drug use: Yes    Types: Marijuana, Heroin    Comment: methadone    Allergies: No Known Allergies  Medications Prior to Admission  Medication Sig Dispense Refill Last Dose  . METHADONE HCL PO Take 20 mg by mouth daily.    03/31/2018 at Unknown time  . Prenatal  Vit-Fe Fumarate-FA (PRENATAL VITAMIN PO) Take by mouth.   03/31/2018 at Unknown time    Review of Systems  Constitutional: Negative for chills and fever.  Gastrointestinal: Negative for nausea and vomiting.  Genitourinary: Negative for frequency, pelvic pain, vaginal bleeding and vaginal discharge.  All other systems reviewed and are negative.  Physical Exam   Blood pressure 125/74, pulse 95, temperature 98.4 F (36.9 C), resp. rate 18, height 5' 2.5" (1.588 m), weight 62.6 kg, last menstrual period 09/16/2017.  Physical Exam  Nursing note and vitals reviewed. Constitutional: She is oriented to person, place, and time. She appears well-developed and well-nourished. No distress.  HENT:  Head: Normocephalic.  Cardiovascular: Normal rate.  Respiratory: Effort normal.  GI: Soft. There is no tenderness. There is no rebound.  Neurological: She is alert and oriented to person, place, and time.  Skin: Skin is warm and dry.  Psychiatric: She has a normal mood and affect.   NST:  Baseline: 135 Variability: moderate Accels: 15x15 Decels: none Toco: rare   MAU Course  Procedures  MDM Patient had apprx 1.5 hours of  monitoring at [facility]. Will monitor here until about 5:15 and then patient can be DC home if no longer having contractions.  Patient has slept while here. Denies pain. FHR tracing remains reactive.   Assessment and Plan   1. Fall, initial encounter   2. Supervision of other normal pregnancy, antepartum   3. [redacted] weeks gestation of pregnancy    DC home Comfort measures reviewed  3rd Trimester precautions  PTL precautions  Fetal kick counts RX: none  Return to MAU as needed FU with OB as planned  [facility] for [facility] Follow up.   Specialty:  Obstetrics and Gynecology Contact information: [place] 04/01/2018, 3:29 AM

## 2018-04-01 NOTE — Progress Notes (Signed)
Written and verbal d/c instructions given and understanding voiced. To keep scheduled appt next wk and return MAU for any concerns

## 2018-04-01 NOTE — MAU Note (Signed)
Fell about 2000. Pt slipped and fell backward and hit back of head. Did not loose consciousness. Have felt FM since then and FM has been normal. Denies vag bleeding or LOF

## 2018-04-01 NOTE — Progress Notes (Signed)
[facility] notified of pt's occ ctx with u/i and reactive FHR. Pt's monitor time is completed and pt stable for d/c home

## 2018-04-01 NOTE — Progress Notes (Signed)
Marcille Buffy CNM in to see pt. EFM strip reviewed

## 2018-04-06 ENCOUNTER — Inpatient Hospital Stay: Admission: RE | Admit: 2018-04-06 | Payer: Self-pay | Source: Ambulatory Visit

## 2018-04-07 ENCOUNTER — Other Ambulatory Visit: Payer: Self-pay

## 2018-04-07 DIAGNOSIS — Z3480 Encounter for supervision of other normal pregnancy, unspecified trimester: Secondary | ICD-10-CM

## 2018-04-18 ENCOUNTER — Other Ambulatory Visit: Payer: Self-pay

## 2018-04-20 ENCOUNTER — Encounter: Payer: Self-pay | Admitting: Obstetrics & Gynecology

## 2018-05-01 ENCOUNTER — Ambulatory Visit (HOSPITAL_COMMUNITY)
Admission: RE | Admit: 2018-05-01 | Discharge: 2018-05-01 | Disposition: A | Discharge status: Home / Self Care | Payer: Medicaid Other | Source: Ambulatory Visit | Attending: Obstetrics and Gynecology | Admitting: Obstetrics and Gynecology

## 2018-05-01 ENCOUNTER — Encounter (HOSPITAL_COMMUNITY): Payer: Self-pay

## 2018-05-05 ENCOUNTER — Encounter: Payer: Self-pay | Admitting: Obstetrics and Gynecology

## 2018-05-10 ENCOUNTER — Ambulatory Visit (HOSPITAL_COMMUNITY)
Admission: RE | Admit: 2018-05-10 | Discharge: 2018-05-10 | Disposition: A | Discharge status: Home / Self Care | Payer: Medicaid Other | Source: Ambulatory Visit | Attending: Obstetrics and Gynecology | Admitting: Obstetrics and Gynecology

## 2018-05-10 ENCOUNTER — Encounter (HOSPITAL_COMMUNITY): Payer: Self-pay

## 2018-05-15 ENCOUNTER — Encounter: Payer: Self-pay | Admitting: Obstetrics & Gynecology

## 2018-06-12 ENCOUNTER — Encounter: Payer: Self-pay | Admitting: Family Medicine

## 2018-06-19 ENCOUNTER — Inpatient Hospital Stay (HOSPITAL_COMMUNITY): Payer: Medicaid Other | Admitting: Anesthesiology

## 2018-06-19 ENCOUNTER — Encounter (HOSPITAL_COMMUNITY): Payer: Self-pay

## 2018-06-19 ENCOUNTER — Inpatient Hospital Stay (HOSPITAL_COMMUNITY)
Admission: AD | Admit: 2018-06-19 | Discharge: 2018-06-22 | DRG: 807 | Disposition: A | Discharge status: Home / Self Care | Payer: Medicaid Other | Attending: Obstetrics and Gynecology | Admitting: Obstetrics and Gynecology

## 2018-06-19 ENCOUNTER — Other Ambulatory Visit: Payer: Self-pay

## 2018-06-19 DIAGNOSIS — F17210 Nicotine dependence, cigarettes, uncomplicated: Secondary | ICD-10-CM | POA: Diagnosis present

## 2018-06-19 DIAGNOSIS — F1990 Other psychoactive substance use, unspecified, uncomplicated: Secondary | ICD-10-CM | POA: Diagnosis present

## 2018-06-19 DIAGNOSIS — F1910 Other psychoactive substance abuse, uncomplicated: Secondary | ICD-10-CM | POA: Diagnosis present

## 2018-06-19 DIAGNOSIS — O99334 Smoking (tobacco) complicating childbirth: Secondary | ICD-10-CM | POA: Diagnosis present

## 2018-06-19 DIAGNOSIS — Z3480 Encounter for supervision of other normal pregnancy, unspecified trimester: Secondary | ICD-10-CM

## 2018-06-19 DIAGNOSIS — D259 Leiomyoma of uterus, unspecified: Secondary | ICD-10-CM | POA: Diagnosis present

## 2018-06-19 DIAGNOSIS — D649 Anemia, unspecified: Secondary | ICD-10-CM | POA: Diagnosis present

## 2018-06-19 DIAGNOSIS — O99320 Drug use complicating pregnancy, unspecified trimester: Secondary | ICD-10-CM | POA: Diagnosis present

## 2018-06-19 DIAGNOSIS — O9902 Anemia complicating childbirth: Secondary | ICD-10-CM | POA: Diagnosis present

## 2018-06-19 DIAGNOSIS — O99324 Drug use complicating childbirth: Principal | ICD-10-CM | POA: Diagnosis present

## 2018-06-19 DIAGNOSIS — Z3A38 38 weeks gestation of pregnancy: Secondary | ICD-10-CM

## 2018-06-19 DIAGNOSIS — O3413 Maternal care for benign tumor of corpus uteri, third trimester: Secondary | ICD-10-CM | POA: Diagnosis present

## 2018-06-19 DIAGNOSIS — Z3483 Encounter for supervision of other normal pregnancy, third trimester: Secondary | ICD-10-CM | POA: Diagnosis present

## 2018-06-19 LAB — OB RESULTS CONSOLE GBS: GBS: NEGATIVE

## 2018-06-19 LAB — GLUCOSE, CAPILLARY
GLUCOSE-CAPILLARY: 55 mg/dL — AB (ref 70–99)
Glucose-Capillary: 108 mg/dL — ABNORMAL HIGH (ref 70–99)

## 2018-06-19 LAB — CBC
HCT: 35.1 % — ABNORMAL LOW (ref 36.0–46.0)
Hemoglobin: 11.4 g/dL — ABNORMAL LOW (ref 12.0–15.0)
MCH: 27.4 pg (ref 26.0–34.0)
MCHC: 32.5 g/dL (ref 30.0–36.0)
MCV: 84.4 fL (ref 80.0–100.0)
PLATELETS: 219 10*3/uL (ref 150–400)
RBC: 4.16 MIL/uL (ref 3.87–5.11)
RDW: 16.5 % — AB (ref 11.5–15.5)
WBC: 8.7 10*3/uL (ref 4.0–10.5)
nRBC: 0 % (ref 0.0–0.2)

## 2018-06-19 LAB — RPR: RPR Ser Ql: NONREACTIVE

## 2018-06-19 LAB — TYPE AND SCREEN
ABO/RH(D): O POS
Antibody Screen: NEGATIVE

## 2018-06-19 LAB — RAPID URINE DRUG SCREEN, HOSP PERFORMED
AMPHETAMINES: POSITIVE — AB
BARBITURATES: NOT DETECTED
BENZODIAZEPINES: NOT DETECTED
Cocaine: NOT DETECTED
Opiates: NOT DETECTED
TETRAHYDROCANNABINOL: POSITIVE — AB

## 2018-06-19 LAB — ABO/RH: ABO/RH(D): O POS

## 2018-06-19 LAB — POCT FERN TEST: POCT Fern Test: POSITIVE

## 2018-06-19 LAB — GROUP B STREP BY PCR: Group B strep by PCR: NEGATIVE

## 2018-06-19 MED ORDER — SOD CITRATE-CITRIC ACID 500-334 MG/5ML PO SOLN
30.0000 mL | ORAL | Status: DC | PRN
  Filled 2018-06-19: qty 15

## 2018-06-19 MED ORDER — PHENYLEPHRINE 40 MCG/ML (10ML) SYRINGE FOR IV PUSH (FOR BLOOD PRESSURE SUPPORT)
80.0000 ug | PREFILLED_SYRINGE | INTRAVENOUS | Status: DC | PRN
  Filled 2018-06-19: qty 5

## 2018-06-19 MED ORDER — EPHEDRINE 5 MG/ML INJ
10.0000 mg | INTRAVENOUS | Status: DC | PRN
  Filled 2018-06-19: qty 2

## 2018-06-19 MED ORDER — OXYTOCIN 40 UNITS IN LACTATED RINGERS INFUSION - SIMPLE MED
2.5000 [IU]/h | INTRAVENOUS | Status: DC
  Administered 2018-06-20: 2.5 [IU]/h via INTRAVENOUS

## 2018-06-19 MED ORDER — LIDOCAINE HCL (PF) 1 % IJ SOLN
30.0000 mL | INTRAMUSCULAR | Status: DC | PRN
  Filled 2018-06-19: qty 30

## 2018-06-19 MED ORDER — PHENYLEPHRINE 40 MCG/ML (10ML) SYRINGE FOR IV PUSH (FOR BLOOD PRESSURE SUPPORT)
80.0000 ug | PREFILLED_SYRINGE | INTRAVENOUS | Status: DC | PRN
  Filled 2018-06-19: qty 5
  Filled 2018-06-19: qty 10

## 2018-06-19 MED ORDER — LIDOCAINE HCL (PF) 1 % IJ SOLN
INTRAMUSCULAR | Status: DC | PRN
  Administered 2018-06-19 (×2): 4 mL via EPIDURAL

## 2018-06-19 MED ORDER — FENTANYL CITRATE (PF) 100 MCG/2ML IJ SOLN
100.0000 ug | INTRAMUSCULAR | Status: DC | PRN
  Administered 2018-06-19 (×2): 100 ug via INTRAVENOUS
  Filled 2018-06-19 (×2): qty 2

## 2018-06-19 MED ORDER — TERBUTALINE SULFATE 1 MG/ML IJ SOLN
INTRAMUSCULAR | Status: AC
  Filled 2018-06-19: qty 1

## 2018-06-19 MED ORDER — OXYTOCIN 40 UNITS IN LACTATED RINGERS INFUSION - SIMPLE MED
1.0000 m[IU]/min | INTRAVENOUS | Status: DC
  Administered 2018-06-19 (×2): 2 m[IU]/min via INTRAVENOUS
  Filled 2018-06-19: qty 1000

## 2018-06-19 MED ORDER — MISOPROSTOL 50MCG HALF TABLET
50.0000 ug | ORAL_TABLET | Freq: Once | ORAL | Status: AC
  Administered 2018-06-19: 50 ug via BUCCAL
  Filled 2018-06-19: qty 1

## 2018-06-19 MED ORDER — DIPHENHYDRAMINE HCL 50 MG/ML IJ SOLN: 12.5000 mg | INTRAMUSCULAR | Status: DC | PRN

## 2018-06-19 MED ORDER — FENTANYL 2.5 MCG/ML BUPIVACAINE 1/10 % EPIDURAL INFUSION (WH - ANES)
14.0000 mL/h | INTRAMUSCULAR | Status: DC | PRN
  Administered 2018-06-19: 14 mL/h via EPIDURAL
  Administered 2018-06-19: 13 mL/h via EPIDURAL
  Filled 2018-06-19 (×2): qty 100

## 2018-06-19 MED ORDER — ONDANSETRON HCL 4 MG/2ML IJ SOLN: 4.0000 mg | Freq: Four times a day (QID) | INTRAMUSCULAR | Status: DC | PRN

## 2018-06-19 MED ORDER — LACTATED RINGERS IV SOLN: 500.0000 mL | Freq: Once | INTRAVENOUS | Status: AC

## 2018-06-19 MED ORDER — LACTATED RINGERS IV SOLN
500.0000 mL | Freq: Once | INTRAVENOUS | Status: AC
  Administered 2018-06-19: 1000 mL via INTRAVENOUS

## 2018-06-19 MED ORDER — LACTATED RINGERS IV SOLN
500.0000 mL | INTRAVENOUS | Status: DC | PRN
  Administered 2018-06-19: 1000 mL via INTRAVENOUS

## 2018-06-19 MED ORDER — TERBUTALINE SULFATE 1 MG/ML IJ SOLN
0.2500 mg | Freq: Once | INTRAMUSCULAR | Status: AC
  Administered 2018-06-19: 0.25 mg via SUBCUTANEOUS

## 2018-06-19 MED ORDER — OXYTOCIN BOLUS FROM INFUSION
500.0000 mL | Freq: Once | INTRAVENOUS | Status: AC
  Administered 2018-06-20: 500 mL via INTRAVENOUS

## 2018-06-19 MED ORDER — LACTATED RINGERS IV SOLN
INTRAVENOUS | Status: DC
  Administered 2018-06-19 (×5): via INTRAVENOUS

## 2018-06-19 MED ORDER — FLEET ENEMA 7-19 GM/118ML RE ENEM: 1.0000 | ENEMA | RECTAL | Status: DC | PRN
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
## 2018-06-19 NOTE — MAU Note (Signed)
Pt felt a "gush of fluid" from her vagina around 3:00a today. Cxts approx 7-10 min apart per pt. Pos FM now; however decreased when water broke.   Gilmer Mor RN
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
## 2018-06-19 NOTE — Progress Notes (Signed)
Patient ID: April Boyd, female   DOB: May 31, 1991, 27 y.o.   MRN: [id number]  Breathing with ctx; had buccal cyto x 1 at 1030  BP 123/85, P 98 FHR 130s, +accels, no decels Ctx q 3 mins Cx per RN was 3-4/70/0  IUP@term  ROM x 10hrs Latent labor GBS neg  It appears pt may be laboring on her own; will reeval cx in 2 hrs or so and start Pit prn [facility]- where pt receives methadone- is closed today and reopens at 0500 11/26; plan to verify dosing then; she is qod with next dose due tomorrow  Myrtis Ser CNM 06/19/2018 12:55 PM

## 2018-06-19 NOTE — Anesthesia Pain Management Evaluation Note (Signed)
  CRNA Pain Management Visit Note  Patient: April Boyd, 27 y.o., female  "Hello I am a member of the anesthesia team at [facility]. We have an anesthesia team available at all times to provide care throughout the hospital, including epidural management and anesthesia for C-section. I don't know your plan for the delivery whether it a natural birth, water birth, IV sedation, nitrous supplementation, doula or epidural, but we want to meet your pain goals."   1.Was your pain managed to your expectations on prior hospitalizations?   Yes   2.What is your expectation for pain management during this hospitalization?     Epidural  3.How can we help you reach that goal? Epidural when appropriate  Record the patient's initial score and the patient's pain goal.   Pain: 7  Pain Goal: 8 The [facility] wants you to be able to say your pain was always managed very well.  Bufford Spikes 06/19/2018
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
## 2018-06-19 NOTE — Anesthesia Procedure Notes (Signed)
Epidural Patient location during procedure: OB Start time: 06/19/2018 3:29 PM End time: 06/19/2018 3:40 PM  Staffing Anesthesiologist: Josephine Igo, MD Performed: anesthesiologist   Preanesthetic Checklist Completed: patient identified, site marked, surgical consent, pre-op evaluation, timeout performed, IV checked, risks and benefits discussed and monitors and equipment checked  Epidural Patient position: sitting Prep: site prepped and draped and DuraPrep Patient monitoring: continuous pulse ox and blood pressure Approach: midline Location: L3-L4 Injection technique: LOR air  Needle:  Needle type: Tuohy  Needle gauge: 17 G Needle length: 9 cm and 9 Needle insertion depth: 7 cm Catheter type: closed end flexible Catheter size: 19 Gauge Catheter at skin depth: 12 cm Test dose: negative and Other  Assessment Events: blood not aspirated, injection not painful, no injection resistance, negative IV test and no paresthesia  Additional Notes Patient identified. Risks and benefits discussed including failed block, incomplete  Pain control, post dural puncture headache, nerve damage, paralysis, blood pressure Changes, nausea, vomiting, reactions to medications-both toxic and allergic and post Partum back pain. All questions were answered. Patient expressed understanding and wished to proceed. Sterile technique was used throughout procedure. Epidural site was Dressed with sterile barrier dressing. No paresthesias, signs of intravascular injection Or signs of intrathecal spread were encountered.  Patient was more comfortable after the epidural was dosed. Please see RN's note for documentation of vital signs and FHR which are stable.

## 2018-06-19 NOTE — Anesthesia Preprocedure Evaluation (Signed)
Anesthesia Evaluation  Patient identified by MRN, date of birth, ID band Patient awake    Reviewed: Allergy & Precautions, Patient's Chart, lab work & pertinent test results  Airway Mallampati: II  TM Distance: >3 FB Neck ROM: Full    Dental no notable dental hx. 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,   Endo/Other  negative endocrine ROS  Renal/GU negative Renal ROS  negative genitourinary   Musculoskeletal negative musculoskeletal ROS (+)   Abdominal   Peds  Hematology  (+) anemia ,   Anesthesia Other Findings   Reproductive/Obstetrics (+) Pregnancy                             Anesthesia Physical Anesthesia Plan  ASA: II  Anesthesia Plan: Epidural   Post-op Pain Management:    Induction:   PONV Risk Score and Plan:   Airway Management Planned: Natural Airway  Additional Equipment:   Intra-op Plan:   Post-operative Plan:   Informed Consent: I have reviewed the patients History and Physical, chart, labs and discussed the procedure including the risks, benefits and alternatives for the proposed anesthesia with the patient or authorized representative who has indicated his/her understanding and acceptance.     Plan Discussed with: Anesthesiologist  Anesthesia Plan Comments:         Anesthesia Quick Evaluation
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
## 2018-06-20 ENCOUNTER — Encounter (HOSPITAL_COMMUNITY): Payer: Self-pay

## 2018-06-20 DIAGNOSIS — Z3A38 38 weeks gestation of pregnancy: Secondary | ICD-10-CM

## 2018-06-20 MED ORDER — IBUPROFEN 600 MG PO TABS
600.0000 mg | ORAL_TABLET | Freq: Once | ORAL | Status: AC
  Administered 2018-06-20: 600 mg via ORAL
  Filled 2018-06-20: qty 1

## 2018-06-20 MED ORDER — ONDANSETRON HCL 4 MG PO TABS: 4.0000 mg | ORAL_TABLET | ORAL | Status: DC | PRN

## 2018-06-20 MED ORDER — WITCH HAZEL-GLYCERIN EX PADS: 1.0000 "application " | MEDICATED_PAD | CUTANEOUS | Status: DC | PRN

## 2018-06-20 MED ORDER — DIBUCAINE 1 % RE OINT: 1.0000 "application " | TOPICAL_OINTMENT | RECTAL | Status: DC | PRN

## 2018-06-20 MED ORDER — METHADONE HCL 10 MG PO TABS
30.0000 mg | ORAL_TABLET | Freq: Every day | ORAL | Status: DC
  Administered 2018-06-20 – 2018-06-22 (×3): 30 mg via ORAL
  Filled 2018-06-20 (×4): qty 3

## 2018-06-20 MED ORDER — PRENATAL MULTIVITAMIN CH
1.0000 | ORAL_TABLET | Freq: Every day | ORAL | Status: DC
  Administered 2018-06-21 – 2018-06-22 (×2): 1 via ORAL
  Filled 2018-06-20 (×2): qty 1

## 2018-06-20 MED ORDER — ZOLPIDEM TARTRATE 5 MG PO TABS: 5.0000 mg | ORAL_TABLET | Freq: Every evening | ORAL | Status: DC | PRN

## 2018-06-20 MED ORDER — ONDANSETRON HCL 4 MG/2ML IJ SOLN: 4.0000 mg | INTRAMUSCULAR | Status: DC | PRN

## 2018-06-20 MED ORDER — COCONUT OIL OIL
1.0000 "application " | TOPICAL_OIL | Status: DC | PRN
  Administered 2018-06-20: 1 via TOPICAL
  Filled 2018-06-20: qty 120

## 2018-06-20 MED ORDER — IBUPROFEN 600 MG PO TABS
600.0000 mg | ORAL_TABLET | Freq: Four times a day (QID) | ORAL | Status: DC
  Administered 2018-06-20 – 2018-06-22 (×9): 600 mg via ORAL
  Filled 2018-06-20 (×9): qty 1

## 2018-06-20 MED ORDER — DIPHENHYDRAMINE HCL 25 MG PO CAPS: 25.0000 mg | ORAL_CAPSULE | Freq: Four times a day (QID) | ORAL | Status: DC | PRN

## 2018-06-20 MED ORDER — ACETAMINOPHEN 325 MG PO TABS
650.0000 mg | ORAL_TABLET | ORAL | Status: DC | PRN
  Administered 2018-06-20 – 2018-06-21 (×5): 650 mg via ORAL
  Filled 2018-06-20 (×5): qty 2

## 2018-06-20 MED ORDER — TETANUS-DIPHTH-ACELL PERTUSSIS 5-2.5-18.5 LF-MCG/0.5 IM SUSP: 0.5000 mL | Freq: Once | INTRAMUSCULAR | Status: DC

## 2018-06-20 MED ORDER — SIMETHICONE 80 MG PO CHEW: 80.0000 mg | CHEWABLE_TABLET | ORAL | Status: DC | PRN

## 2018-06-20 MED ORDER — SENNOSIDES-DOCUSATE SODIUM 8.6-50 MG PO TABS
2.0000 | ORAL_TABLET | ORAL | Status: DC
  Filled 2018-06-20 (×2): qty 2

## 2018-06-20 MED ORDER — BENZOCAINE-MENTHOL 20-0.5 % EX AERO: 1.0000 "application " | INHALATION_SPRAY | CUTANEOUS | Status: DC | PRN

## 2018-06-20 NOTE — Anesthesia Postprocedure Evaluation (Signed)
Anesthesia Post Note  Patient: April Boyd  Procedure(s) Performed: AN AD [place]     Patient location during evaluation: Mother Baby Anesthesia Type: Epidural Level of consciousness: awake and alert Pain management: pain level controlled Vital Signs Assessment: post-procedure vital signs reviewed and stable Respiratory status: spontaneous breathing, nonlabored ventilation and respiratory function stable Cardiovascular status: stable Postop Assessment: no headache, no backache and epidural receding Anesthetic complications: no    Last Vitals:  Vitals:   06/20/18 0320 06/20/18 0422  BP: 121/83 130/72  Pulse: 98 82  Resp: 18 18  Temp: 36.6 C 36.7 C  SpO2: 99% 100%    Last Pain:  Vitals:   06/20/18 0555  TempSrc:   PainSc: Asleep   Pain Goal: Patients Stated Pain Goal: 10 (06/19/18 0720)               Dominique Calvey

## 2018-06-20 NOTE — Anesthesia Postprocedure Evaluation (Signed)
Anesthesia Post Note  Patient: Chevonne Bostrom  Procedure(s) Performed: AN AD [place]     Patient location during evaluation: Mother Baby Anesthesia Type: Epidural Level of consciousness: awake, awake and alert, oriented and patient cooperative Pain management: pain level controlled Vital Signs Assessment: post-procedure vital signs reviewed and stable Respiratory status: respiratory function stable, spontaneous breathing and nonlabored ventilation Cardiovascular status: stable Postop Assessment: no headache, no backache, patient able to bend at knees, no apparent nausea or vomiting and able to ambulate Anesthetic complications: no    Last Vitals:  Vitals:   06/20/18 0320 06/20/18 0422  BP: 121/83 130/72  Pulse: 98 82  Resp: 18 18  Temp: 36.6 C 36.7 C  SpO2: 99% 100%    Last Pain:  Vitals:   06/20/18 0900  TempSrc:   PainSc: 8    Pain Goal: Patients Stated Pain Goal: 0 (06/20/18 0900)               Marq Rebello L

## 2018-06-20 NOTE — Progress Notes (Signed)
CLINICAL SOCIAL WORK MATERNAL/CHILD NOTE  Patient Details  Name: April Boyd MRN: [id number] Date of Birth: 06/20/2019  Date:  06/20/2018  Clinical Social Worker Initiating Note:  April Boyd, [place]   Date/Time: Initiated:  06/20/18/1502             Child's Name:  April Boyd   Biological Parents:  Mother, Father(April Boyd 52)   Need for Interpreter:  None   Reason for Referral:  Current Substance Use/Substance Use During Pregnancy    Address:  [place] [address]    Phone number:  [phone] (home)     Additional phone number:   Household Members/Support Persons (HM/SP):       HM/SP Name Relationship DOB or Age  HM/SP -1     HM/SP -2     HM/SP -3     HM/SP -4     HM/SP -5     HM/SP -6     HM/SP -7     HM/SP -8       Natural Supports (not living in the home): Extended Family   Professional Supports:None   Employment:Unemployed   Type of Work:     Education:  Some Therapist, occupational arranged:    Financial Resources:Self-Pay    Other Resources: [employer]   Cultural/Religious Considerations Which May Impact Care:   Strengths: Home prepared for child , Ability to meet basic needs    Psychotropic Medications:         Pediatrician:       Pediatrician List:   [place]     Pediatrician Fax Number:    Risk Factors/Current Problems: Substance Use    Cognitive State: Alert , Able to Concentrate , Linear Thinking    Mood/Affect: Apprehensive , Calm    CSW Assessment: CSW spoke with MOB at bedside regarding consult, FOB present and asleep. MOB granted CSW verbal permission to speak with her with FOB present. CSW introduced self and explained reason for consult. CSW informed MOB about hospital drug policy and that baby tested positive for amphetamines. CSW inquired about  MOB substance use during pregnancy, MOB reported that she smoked marijuana but not like that. CSW asked MOB what type of amphetamine did she use, MOB got quiet and appeared to be thinking. MOB eventually reported "roxies" (oxycodone) and that they were crushed when she got them so she doesn't know if they were mixed with something. CSW asked MOB what other substances does she use or has used in the past, MOB reported only roxies and marijuana. Per chart review of [facility] OB records, MOB reported using heroin during pregnancy. MOB did not report any heroin use during assessment. CSW inquired about MOB's methadone use. MOB reported that [facility] and has take home doses. MOB reported that she started using Methadone because of "roxie" use. CSW asked MOB if she intended to keep using drugs, MOB reported no. MOB asked if CPS would take her baby, CSW explained that CPS would follow up with MOB to do an assessment and come up with a safety plan to keep baby safe and to ensure that baby gets the care that she needs. CSW inquired about MOB support system, MOB reported that she lives alone but has family locally that are supportive. CSW inquired about MOB having items to care for baby, MOB reported that she  has all items to care for baby she only has to get wipes. CSW inquired about MOB mental health history, MOB reported no mental health history. CSW assessed for safety, MOB denied SI, HI and domestic violence.   CSW provided education regarding the baby blues period vs. perinatal mood disorders, discussed treatment and gave resources for mental health follow up if concerns arise.  CSW recommends self-evaluation during the postpartum time period using the New Mom Checklist from Postpartum Progress and encouraged MOB to contact a medical professional if symptoms are noted at any time.    CSW provided review of Sudden Infant Death Syndrome (SIDS) precautions.    CSW made report  to [place] for positive UDS for amphetamines. CSW will continue to follow and assist with discharge planning.   CSW Plan/Description: Sudden Infant Death Syndrome (SIDS) Education, Perinatal Mood and Anxiety Disorder (PMADs) Education, [place], Child Protective Service Report , CSW Will Continue to Monitor Umbilical Cord Tissue Drug Screen Results and Make Report if Warranted, CSW Awaiting CPS Disposition Plan    Burnis Medin, LCSW 06/20/2018, 3:28 PM

## 2018-06-20 NOTE — Addendum Note (Signed)
Addendum  created 06/20/18 6837 by Raenette Rover, CRNA   Charge Capture section accepted, Sign clinical note

## 2018-06-20 NOTE — Lactation Note (Signed)
This note was copied from a baby's chart. Lactation Consultation Note  Patient Name: Girl Kayleeann Huxford [id number] Date: 06/20/2018 Reason for consult: 1st time breastfeeding;Primapara(when into see [place] and students and instructors seeing mom and baby - will check back )   Maternal Data    Feeding Feeding Type: Formula Nipple Type: Slow - flow  LATCH Score                   Interventions    Lactation Tools Discussed/Used     Consult Status Consult Status: Follow-up Date: 06/20/18 Follow-up type: In-patient    [place] 06/20/2018, 3:40 PM

## 2018-06-20 NOTE — Lactation Note (Signed)
This note was copied from a baby's chart. Lactation Consultation Note  Patient Name: April Boyd [id number] Date: 06/20/2018 Reason for consult: Initial assessment;Primapara;1st time breastfeeding;Early term 37-38.6wks;Other (Comment)(ESC / LC showed mom PACE feeding and assisted )  Prior to seeing mom [place] spoke with Dr. Suezanne Jacquet Delora Fuel and NP - Fanny Dance regarding the positive urine drug screen on the baby -for Amphetamines and concerns whether mom should be breastfeeding. Per both dr. Suezanne Jacquet Delora Fuel and NP Fanny Dance , mom was ok to breast feed, also due to mom being on Methadone. Consider baby ESC and treat baby as an Early term baby. Order earlier to supplement with formula.  Baby is 86 hours old  As LC entered the room for the next time mom was ready to breast feed.  Baby fussy/ LC checked diaper and changed a wet diaper.  LC showed mom how to hand express and she repeated with a few drops of colostrum.  Baby latched with lots of assistance to obtain the depth and few swallows noted, baby pushed off after about 5 mins and would not stay latched.  LC showed mom how to PACE feed formula and baby took 6 ml  After a small portion spit up medium amount and then was able to grip the green nipple  And complete total of 6 ml.  After feeding baby STS with mom / asleep.  As LC was working with mom with PACE feeding / dad , aunt and friend came to visit.  LC stressed the importance of feeding STS, with cues and not to go over 3 hours without Feeding. If she was having difficulty feeding to call for assistance .  Mother informed of post-discharge support and given phone number to the lactation department, including services for phone call assistance; out-patient appointments; and breastfeeding support group. List of other breastfeeding resources in the community given in the handout. Encouraged mother to call for problems or concerns related to breastfeeding.    Maternal Data Has patient  been taught Hand Expression?: Yes Does the patient have breastfeeding experience prior to this delivery?: No  Feeding Feeding Type: Formula Nipple Type: Slow - flow  LATCH Score Latch: Repeated attempts needed to sustain latch, nipple held in mouth throughout feeding, stimulation needed to elicit sucking reflex.  Audible Swallowing: A few with stimulation  Type of Nipple: Everted at rest and after stimulation  Comfort (Breast/Nipple): Soft / non-tender  Hold (Positioning): Assistance needed to correctly position infant at breast and maintain latch.  LATCH Score: 7  Interventions Interventions: Breast feeding basics reviewed  Lactation Tools Discussed/Used WIC Program: Yes(per  mom )   Consult Status Consult Status: Follow-up Date: 06/21/18 Follow-up type: In-patient    [place] 06/20/2018, 5:10 PM

## 2018-06-21 ENCOUNTER — Encounter: Payer: Self-pay | Admitting: Obstetrics & Gynecology

## 2018-06-21 ENCOUNTER — Encounter (HOSPITAL_COMMUNITY): Payer: Self-pay | Admitting: *Deleted

## 2018-06-21 NOTE — Progress Notes (Addendum)
CSW spoke with [place] worker Rosita Kea). CPS worker reported that a safety plan was established between [place] and MOB. CPS reported that the safety plan is for the baby to discharge to father Celene Kras) when baby is medically stable. CSW requested a letter stating safety plan, CPS worker agreed to send a letter via email.   Plan is for baby to discharge to FOB Celene Kras).   CSW received an email from [place] worker [facility]) stating that they are not able to provide a letter stating discharge plan. Plan still remains that baby will discharge to FOB Celene Kras).  Abundio Miu, [place] Worker [facility] Cell#: [phone]

## 2018-06-21 NOTE — Progress Notes (Signed)
Post Partum Day 1 Subjective: April Boyd  is a 27 y.o. G1P1001 s/p SVD. at [redacted]w[redacted]d.  She reports she is doing well. No acute events overnight. She denies any problems with ambulating, voiding or po intake. Denies nausea or vomiting.  Pain is well controlled on ibuprofen. Lochia is moderate and improving.   Objective: Blood pressure 138/83, pulse (!) 58, temperature 97.9 F (36.6 C), temperature source Oral, resp. rate 18, height 5' 2.5" (1.588 m), weight 62.6 kg, last menstrual period 09/16/2017, SpO2 100 %, currently breastfeeding.  Physical Exam:  General: alert, cooperative, fatigued and no distress Lochia: appropriate Uterine Fundus: firm, U-even Incision: n/a DVT Evaluation: No evidence of DVT seen on physical exam. Negative Homan's sign. No cords or calf tenderness. No significant calf/ankle edema.  Recent Labs    06/19/18 0644  HGB 11.4*  HCT 35.1*    Assessment/Plan: Plan for discharge tomorrow, Breastfeeding, Lactation consult, Social Work consult and Contraception unsure   LOS: 2 days   Laury Deep CNM 06/21/2018, 6:26 AM

## 2018-06-21 NOTE — Progress Notes (Signed)
CSW acknowledges consult for edinburgh score 10. CSW completed full psychosocial assessment with MOB on 06/20/2018 and provided Perinatal Mood and Anxiety Disorder (PMADs) education. CSW signing off, of consult. CSW made a CPS report on 06/20/18, awaiting CPS disposition plan.   Abundio Miu, [place] Worker [facility] Cell#: [phone]

## 2018-06-21 NOTE — Progress Notes (Signed)
POSTPARTUM PROGRESS NOTE  Post Partum Day 1  Subjective:  April Boyd is a 27 y.o. G1P1001 s/p SDV at [redacted]w[redacted]d.  She reports she is doing well but is very tired. No acute events overnight. She denies any problems with ambulating, voiding or po intake. Denies nausea or vomiting.  Pain is well controlled.  Lochia is moderate (6 pads in 24 hours, not fully saturated). She has not had a bowel movement since delivery but is passing gas.   Objective: Blood pressure 138/83, pulse (!) 58, temperature 97.9 F (36.6 C), temperature source Oral, resp. rate 18, height 5' 2.5" (1.588 m), weight 62.6 kg, last menstrual period 09/16/2017, SpO2 100 %, unknown if currently breastfeeding.  Physical Exam:  Physical Exam  Constitutional: She is oriented to person, place, and time and well-developed, well-nourished, and in no distress.  HENT:  Head: Normocephalic and atraumatic.  Eyes: Right eye exhibits no discharge. Left eye exhibits no discharge. No scleral icterus.  Neck: Neck supple. No JVD present. No tracheal deviation present.  Pulmonary/Chest: Effort normal and breath sounds normal.  Abdominal: Soft.  Neurological: She is alert and oriented to person, place, and time.  Skin: Skin is warm and dry.  Psychiatric: Mood and affect normal.  Vitals reviewed.   Recent Labs    06/19/18 0644  HGB 11.4*  HCT 35.1*    Assessment/Plan: April Boyd is a 27 y.o. G1P1001 s/p SVD at [redacted]w[redacted]d   PPD#1 - Doing well  Routine postpartum care  Consult with lactation services re: breastfeed and methadone  Contraception: Considering but deferred decision at this time  Feeding: Bottle v. Breast, currently leaning toward bottle Dispo: Awaiting social work consult, lactation.    LOS: 2 days   Vonita Moss, MS3  06/21/2018, 9:34 AM

## 2018-06-22 MED ORDER — IBUPROFEN 600 MG PO TABS: 600.0000 mg | ORAL_TABLET | Freq: Four times a day (QID) | ORAL | 0 refills | Status: DC

## 2018-06-22 NOTE — Discharge Instructions (Signed)
Postpartum Care After Vaginal Delivery °The period of time right after you deliver your newborn is called the postpartum period. °What kind of medical care will I receive? °· You may continue to receive fluids and medicines through an IV tube inserted into one of your veins. °· If an incision was made near your vagina (episiotomy) or if you had some vaginal tearing during delivery, cold compresses may be placed on your episiotomy or your tear. This helps to reduce pain and swelling. °· You may be given a squirt bottle to use when you go to the bathroom. You may use this until you are comfortable wiping as usual. To use the squirt bottle, follow these steps: °? Before you urinate, fill the squirt bottle with warm water. Do not use hot water. °? After you urinate, while you are sitting on the toilet, use the squirt bottle to rinse the area around your urethra and vaginal opening. This rinses away any urine and blood. °? You may do this instead of wiping. As you start healing, you may use the squirt bottle before wiping yourself. Make sure to wipe gently. °? Fill the squirt bottle with clean water every time you use the bathroom. °· You will be given sanitary pads to wear. °How can I expect to feel? °· You may not feel the need to urinate for several hours after delivery. °· You will have some soreness and pain in your abdomen and vagina. °· If you are breastfeeding, you may have uterine contractions every time you breastfeed for up to several weeks postpartum. Uterine contractions help your uterus return to its normal size. °· It is normal to have vaginal bleeding (lochia) after delivery. The amount and appearance of lochia is often similar to a menstrual period in the first week after delivery. It will gradually decrease over the next few weeks to a dry, yellow-brown discharge. For most women, lochia stops completely by 6-8 weeks after delivery. Vaginal bleeding can vary from woman to woman. °· Within the first few  days after delivery, you may have breast engorgement. This is when your breasts feel heavy, full, and uncomfortable. Your breasts may also throb and feel hard, tightly stretched, warm, and tender. After this occurs, you may have milk leaking from your breasts. Your health care provider can help you relieve discomfort due to breast engorgement. Breast engorgement should go away within a few days. °· You may feel more sad or worried than normal due to hormonal changes after delivery. These feelings should not last more than a few days. If these feelings do not go away after several days, speak with your health care provider. °How should I care for myself? °· Tell your health care provider if you have pain or discomfort. °· Drink enough water to keep your urine clear or pale yellow. °· Wash your hands thoroughly with soap and water for at least 20 seconds after changing your sanitary pads, after using the toilet, and before holding or feeding your baby. °· If you are not breastfeeding, avoid touching your breasts a lot. Doing this can make your breasts produce more milk. °· If you become weak or lightheaded, or you feel like you might faint, ask for help before: °? Getting out of bed. °? Showering. °· Change your sanitary pads frequently. Watch for any changes in your flow, such as a sudden increase in volume, a change in color, the passing of large blood clots. If you pass a blood clot from your vagina, save it   to show to your health care provider. Do not flush blood clots down the toilet without having your health care provider look at them. °· Make sure that all your vaccinations are up to date. This can help protect you and your baby from getting certain diseases. You may need to have immunizations done before you leave the hospital. °· If desired, talk with your health care provider about methods of family planning or birth control (contraception). °How can I start bonding with my baby? °Spending as much time as  possible with your baby is very important. During this time, you and your baby can get to know each other and develop a bond. Having your baby stay with you in your room (rooming in) can give you time to get to know your baby. Rooming in can also help you become comfortable caring for your baby. Breastfeeding can also help you bond with your baby. °How can I plan for returning home with my baby? °· Make sure that you have a car seat installed in your vehicle. °? Your car seat should be checked by a certified car seat installer to make sure that it is installed safely. °? Make sure that your baby fits into the car seat safely. °· Ask your health care provider any questions you have about caring for yourself or your baby. Make sure that you are able to contact your health care provider with any questions after leaving the hospital. °This information is not intended to replace advice given to you by your health care provider. Make sure you discuss any questions you have with your health care provider. °Document Released: 05/09/2007 Document Revised: 12/15/2015 Document Reviewed: 06/16/2015 °Elsevier Interactive Patient Education © 2018 [employer] ° °

## 2018-06-22 NOTE — Discharge Summary (Signed)
Postpartum Discharge Summary     Patient Name: April Boyd DOB: Nov 02, 1990 MRN: [id number]  Date of admission: 06/19/2018 Delivering Provider: Lajean Manes   Date of discharge: 06/22/2018  Admitting diagnosis: 77WKS WATER BROKE Intrauterine pregnancy: [redacted]w[redacted]d     Secondary diagnosis:  Active Problems:   Drug abuse during pregnancy [facility])   Indication for care in labor or delivery   Uterine fibroid   SVD (spontaneous vaginal delivery)  Additional problems: social: CPS case open for substance use during pregnancy and safety plan established- baby to go home with FOB. Mother currently on methadone.     Discharge diagnosis: Term Pregnancy Delivered                                                                                                Post partum procedures:none  Augmentation: Pitocin  Complications: None  Hospital course:  Onset of Labor With Vaginal Delivery     27 y.o. yo G1P1001 at [redacted]w[redacted]d was admitted in Latent Labor on 06/19/2018. Patient had an uncomplicated labor course as follows:  Membrane Rupture Time/Date: 5:27 AM ,06/19/2018   Intrapartum Procedures: Episiotomy: None [1]                                         Lacerations:  None [1]  Patient had a delivery of a Viable infant. 06/20/2018  Information for the patient's newborn:  Zakira, Ressel [[id number]]  Delivery Method: Vag-Spont    Pateint had an uncomplicated postpartum course.  She is ambulating, tolerating a regular diet, passing flatus, and urinating well. Patient is discharged home in stable condition on 06/22/18.   Magnesium Sulfate recieved: No BMZ received: No  Physical exam  Vitals:   06/21/18 1434 06/21/18 1435 06/21/18 2304 06/22/18 0618  BP: (!) 143/88 139/85 122/81 136/80  Pulse: 60 65 61 (!) 57  Resp: 17  18 18   Temp: 97.6 F (36.4 C)  97.8 F (36.6 C) 98 F (36.7 C)  TempSrc: Oral  Oral   SpO2: 100%  100% 100%  Weight:      Height:       General: alert,  cooperative and no distress Lochia: appropriate Uterine Fundus: firm Incision: N/A DVT Evaluation: No evidence of DVT seen on physical exam. Labs: Lab Results  Component Value Date   WBC 8.7 06/19/2018   HGB 11.4 (L) 06/19/2018   HCT 35.1 (L) 06/19/2018   MCV 84.4 06/19/2018   PLT 219 06/19/2018   No flowsheet data found.  Discharge instruction: per After Visit Summary and "Baby and Me Booklet".  After visit meds:  Allergies as of 06/22/2018   No Known Allergies     Medication List    TAKE these medications   ibuprofen 600 MG tablet Commonly known as:  ADVIL,MOTRIN Take 1 tablet (600 mg total) by mouth every 6 (six) hours.   PRENATAL VITAMIN PO Take by mouth.       Diet: routine diet  Activity: Advance as tolerated. Pelvic rest for  6 weeks.   Outpatient follow up:4 weeks Follow up Appt: Future Appointments  Date Time Provider [place]  07/24/2018  4:20 PM Nicolette Bang, DO [facility]   Follow up Visit:   Please schedule this patient for Postpartum visit in: 4 weeks with the following provider: Any provider For C/S patients schedule nurse incision check in weeks 2 weeks: no High risk pregnancy complicated by: substance use Delivery mode:  SVD Anticipated Birth Control:  other/unsure PP Procedures needed: none  Schedule Integrated [place] visit: yes Patient Edinburgh score 10 post partem. Received resources inpatient from [place].    Newborn Data: Live born female  Birth Weight: 6 lb 3.8 oz (2829 g) APGAR: 8, 9  Newborn Delivery   Birth date/time:  06/20/2018 01:33:00 Delivery type:  Vaginal, Spontaneous     Baby Feeding: both Disposition:home with father Celene Kras) per CPS safety plan   06/22/2018 Richarda Osmond, DO

## 2018-07-24 ENCOUNTER — Ambulatory Visit: Payer: Self-pay | Admitting: Internal Medicine

## 2019-04-16 IMAGING — US US MFM FETAL NUCHAL TRANSLUCENCY
1 series · 15 of 28 positions shown · non-contrast
Comparison: none

[Series 1: us mfm fetal nuchal translucency · 15 of 61 slices shown]
[im 1/61]
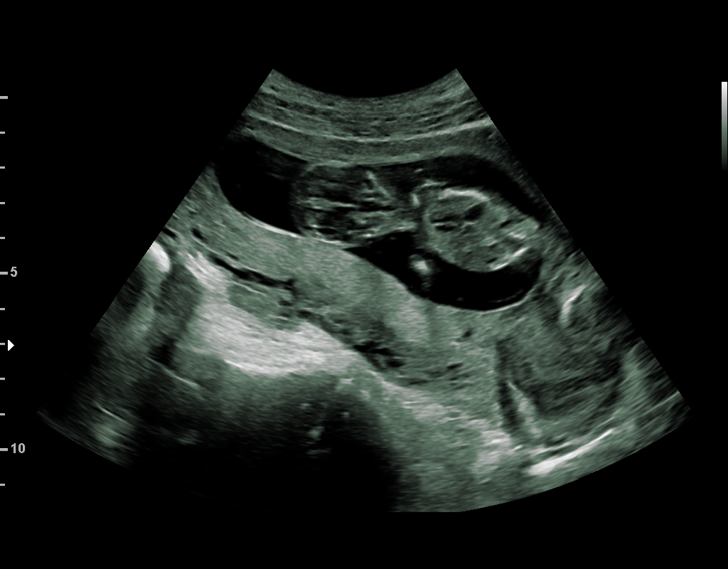
[im 5/61]
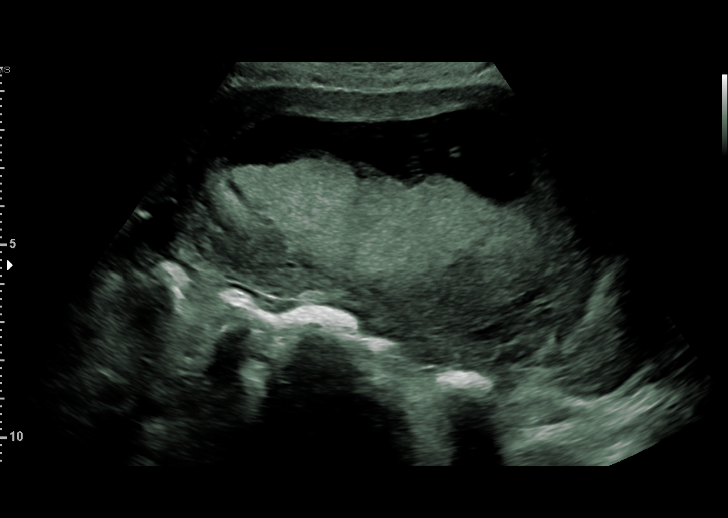
[im 9/61]
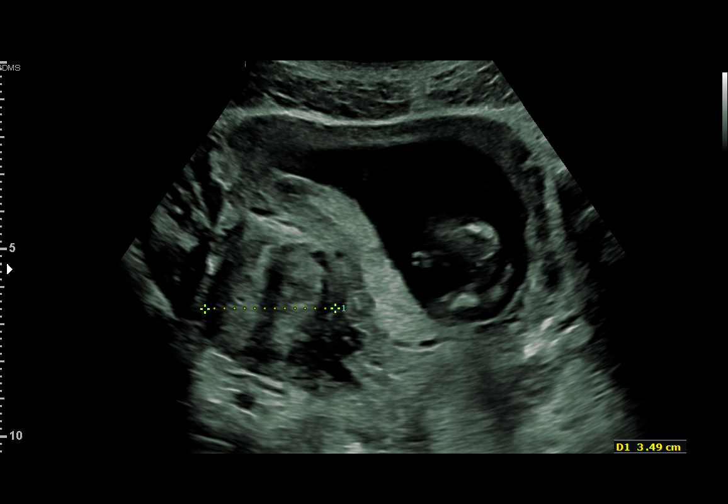
[im 14/61]
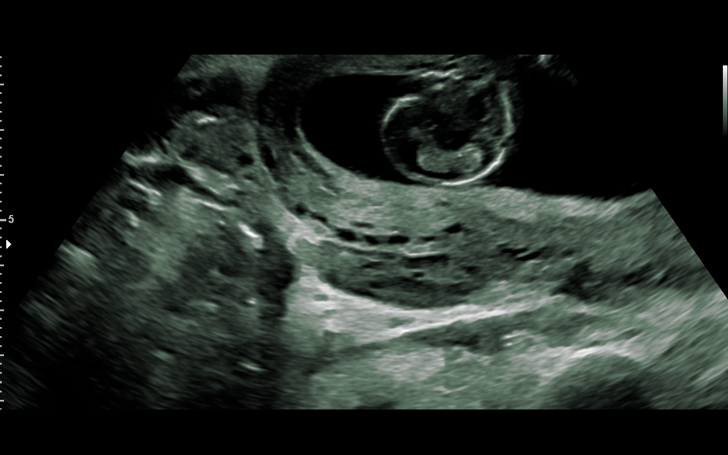
[im 18/61]
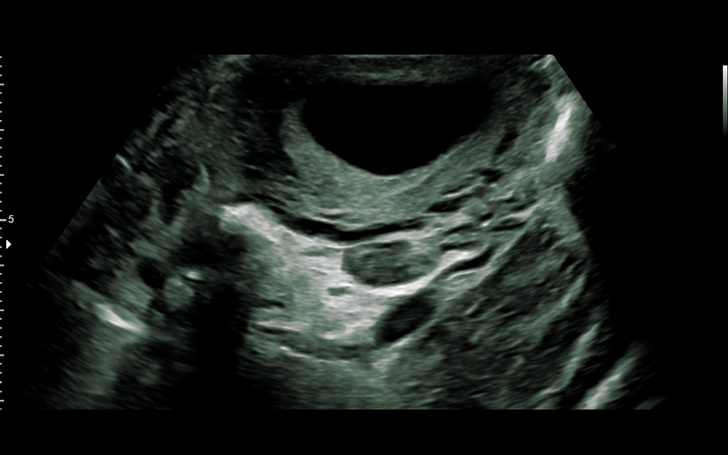
[im 23/61]
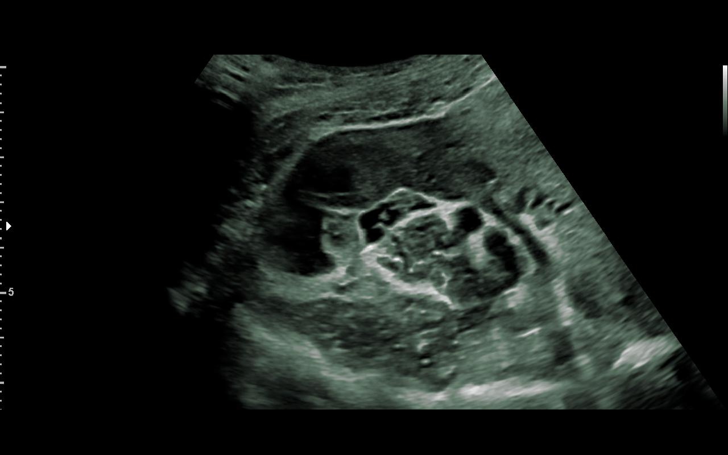
[im 27/61]
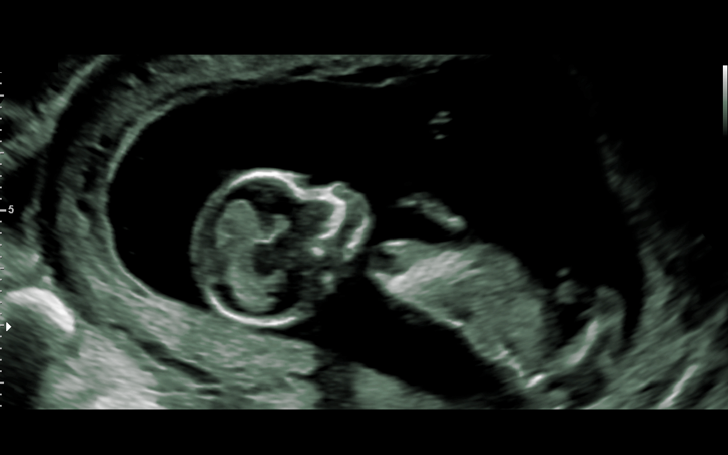
[im 32/61]
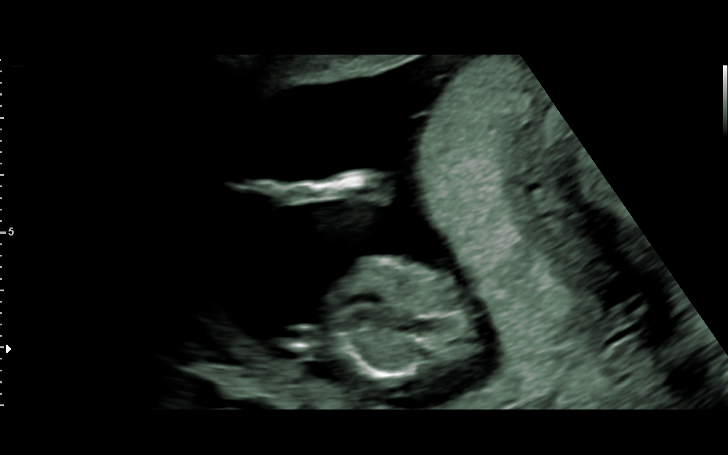
[im 34/61]
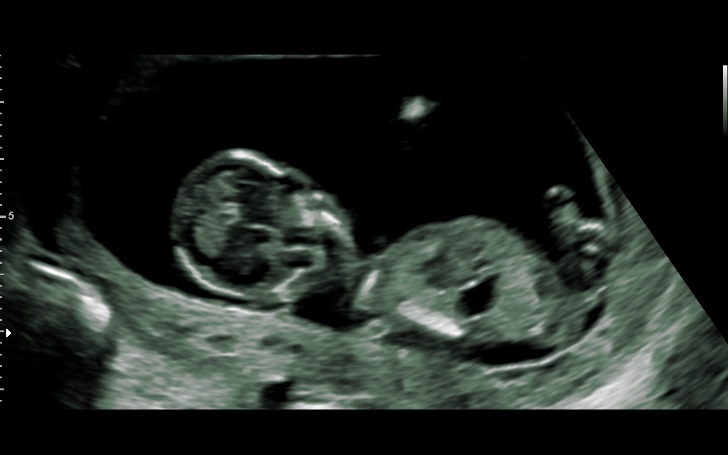
[im 38/61]
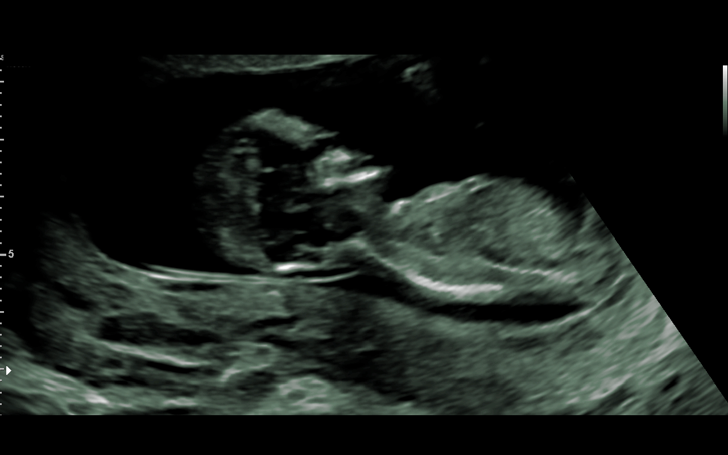
[im 43/61]
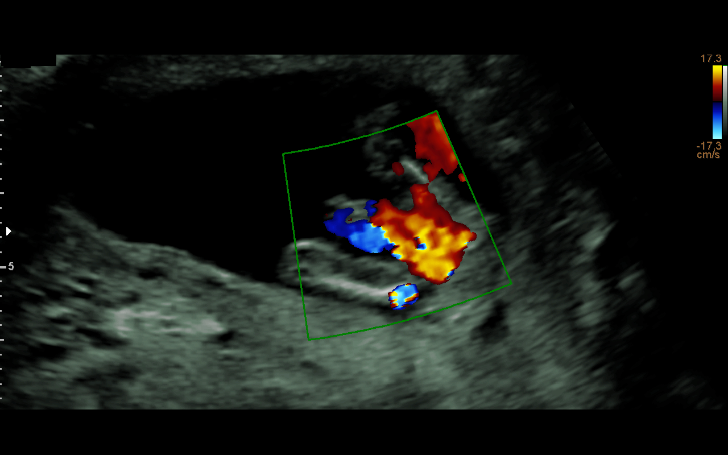
[im 47/61]
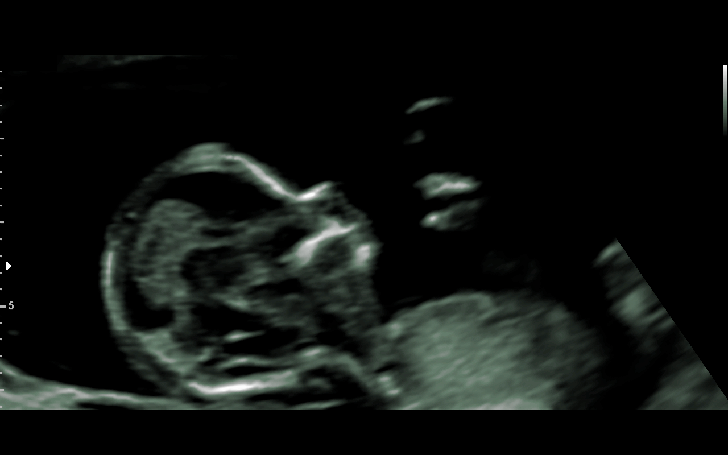
[im 52/61]
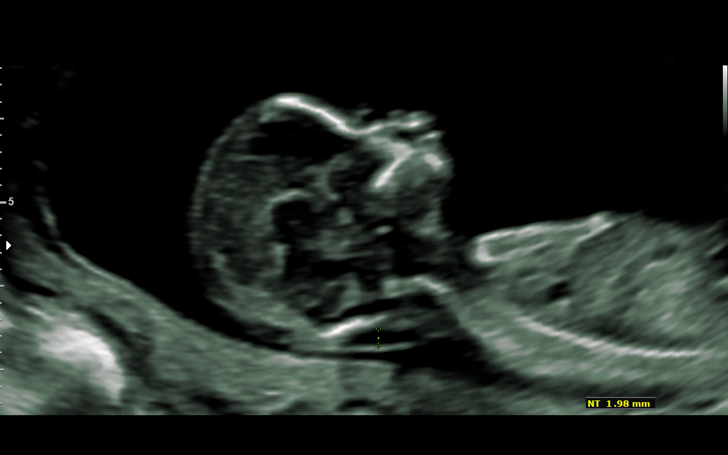
[im 56/61]
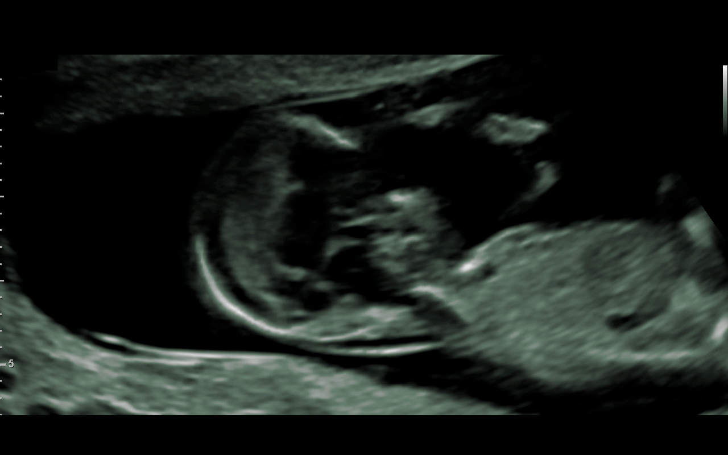
[im 61/61]
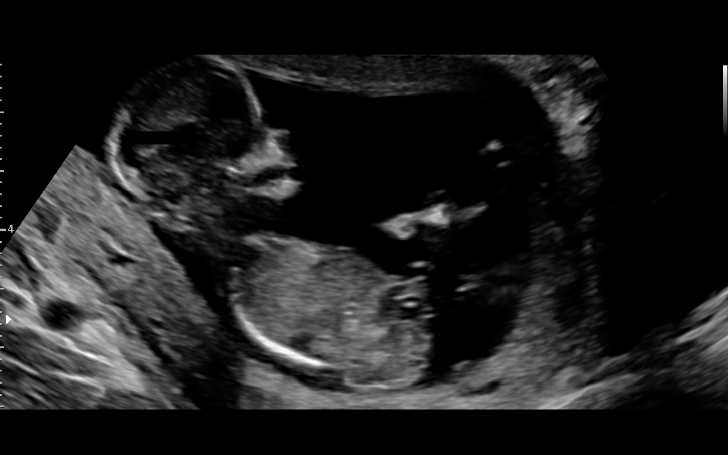

[15 of 28 positions shown; findings below may reference images not displayed]

Health Department
Nemesi NP

TRANSLUCENCY

1  RAMIREDDY                [id number]      [id number]     [id number]
KUOT
Indications

13 weeks gestation of pregnancy
Encounter for nuchal translucency
Uterine fibroids
Heroin/Methadone use (both)
Marijuana Abuse
OB History

Gravidity:    1
Fetal Evaluation

Num Of Fetuses:     1
Fetal Heart         175
Rate(bpm):
Cardiac Activity:   Observed
Placenta:           Posterior

Amniotic Fluid
AFI FV:      Subjectively within normal limits
Biometry

CRL:      72.4  mm     G. Age:  13w 1d                  EDD:   06/29/18
NT:       1.98  mm
Gestational Age
Clinical EDD:  13w 1d                                        EDD:   06/29/18
Best:          13w 1d     Det. By:  Clinical EDD             EDD:   06/29/18
Anatomy

Cranium:               Appears normal         Abdomen:                Appears normal
Choroid Plexus:        Appears normal         Abdominal Wall:         Appears nml (cord
insert, abd wall)
Face:                  Appears normal         Kidneys:                Appear normal
(orbits and profile)
Thoracic:              Appears normal         Bladder:                Appears normal
Diaphragm:             Appears normal         Upper Extremities:      Appears normal
Stomach:               Appears normal, left   Lower Extremities:      Visualized
sided

Other:  Technically difficult due to early gestational age.
Cervix Uterus Adnexa

Left Ovary
Within normal limits.

Right Ovary
Within normal limits.

Cul De Sac:   Small amount of free fluid seen.
Myomas

Site                     L(cm)      W(cm)      D(cm)      Location
Right                    3.6        3.7        3.5        Subserosal
Right                    2.5        1.8        2.2        Subserosal

Blood Flow                 RI        PI       Comments

Impression

Singe intrauterine pregnancy at 13w 1d.
NT 1.98 mm.
Nasal bone not able to be assessed
Limited anatomy appears normal for gestational age.

First trimester screen performed.
Recommendations

Fetal survey at 18 weeks.

## 2019-12-21 IMAGING — US US OB TRANSVAGINAL
1 series · 13 of 28 positions shown · non-contrast
Comparison: None.

CLINICAL DATA: Vaginal bleeding with left lower quadrant cramping

EXAM:
OBSTETRIC <14 WK US AND TRANSVAGINAL OB US
TECHNIQUE: Both transabdominal and transvaginal ultrasound examinations were
performed for complete evaluation of the gestation as well as the
maternal uterus, adnexal regions, and pelvic cul-de-sac.
Transvaginal technique was performed to assess early pregnancy.

[Series 1: us ob transvaginal · 0.18mm/px · 13 of 63 slices shown]
[im 3/63]
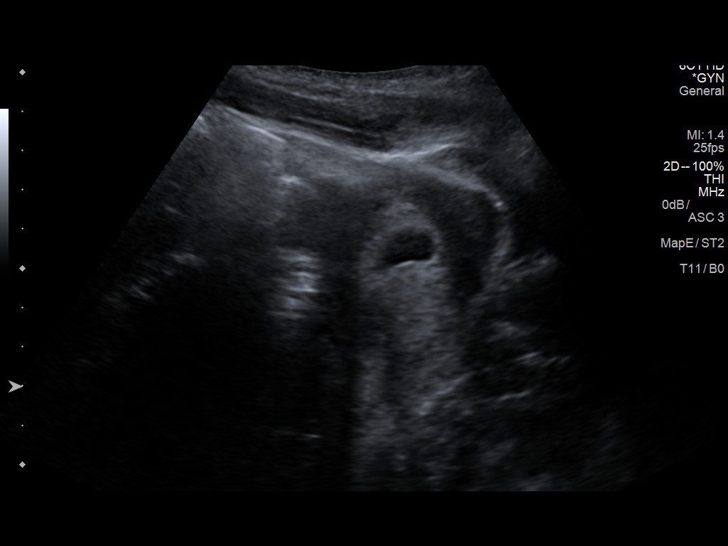
[im 7/63]
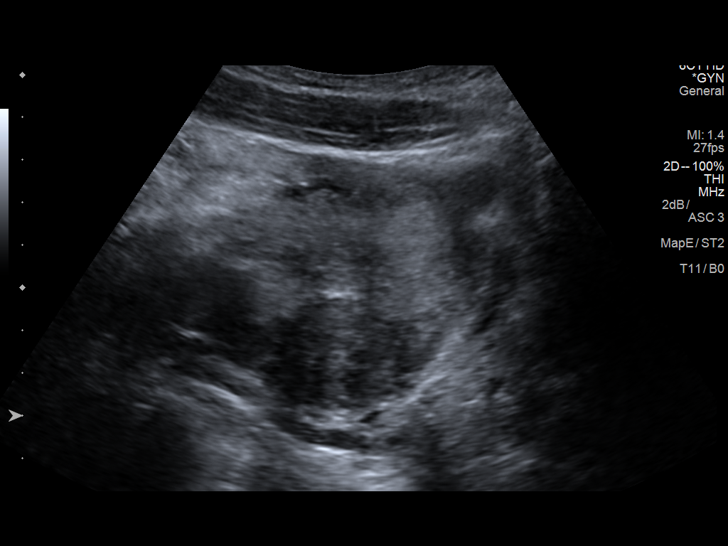
[im 12/63]
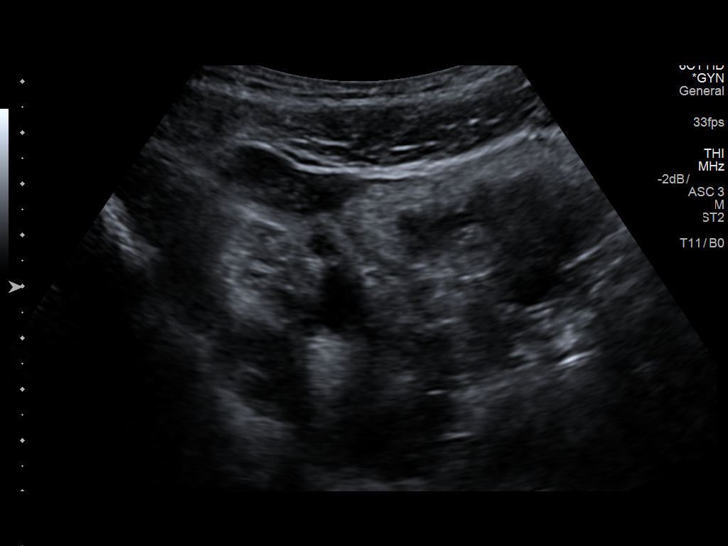
[im 17/63]
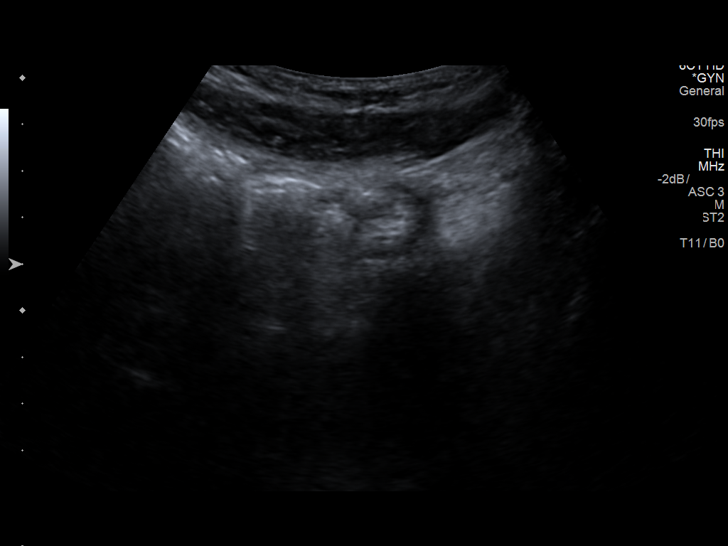
[im 21/63]
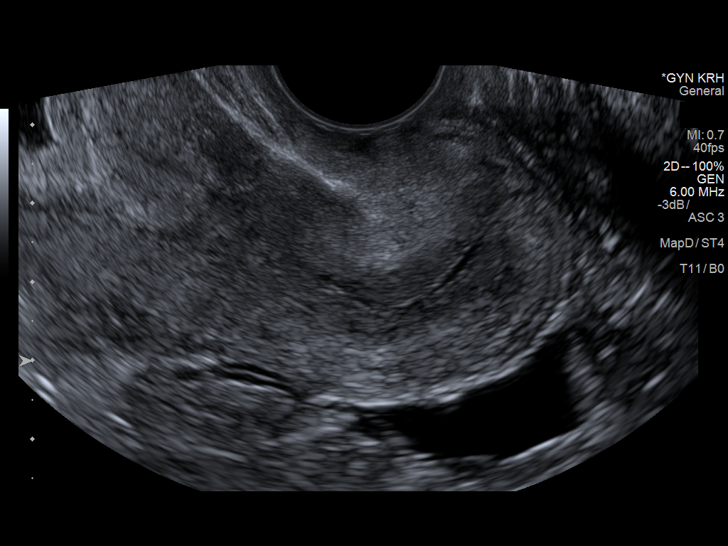
[im 26/63]
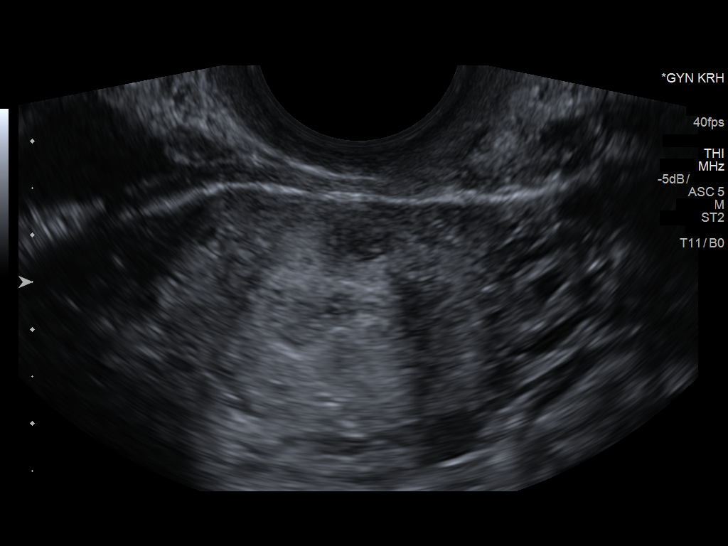
[im 33/63]
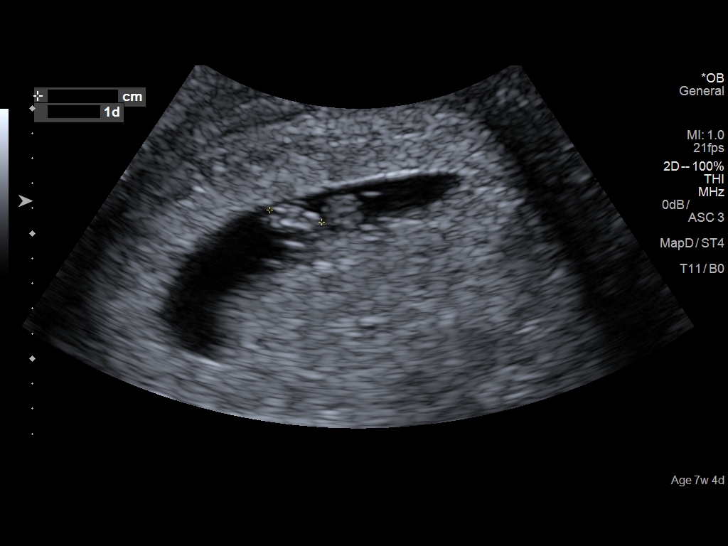
[im 37/63]
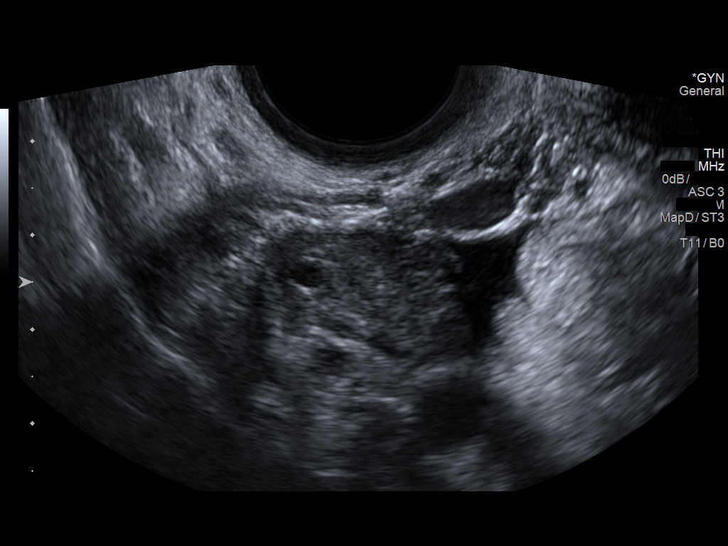
[im 42/63]
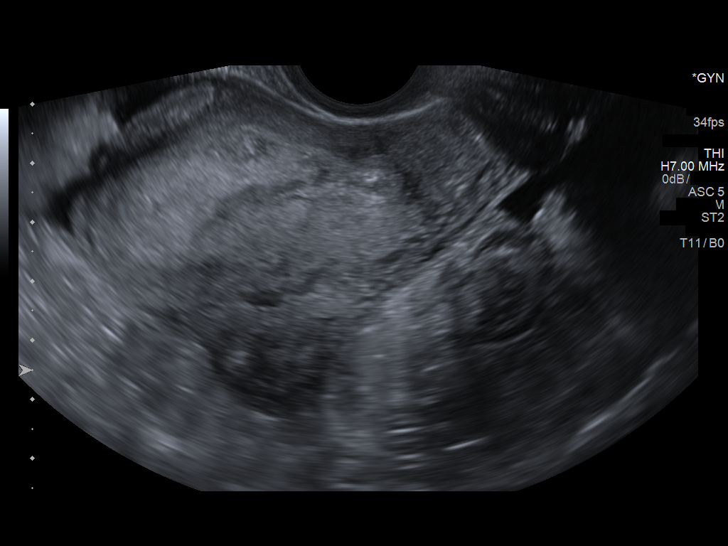
[im 46/63]
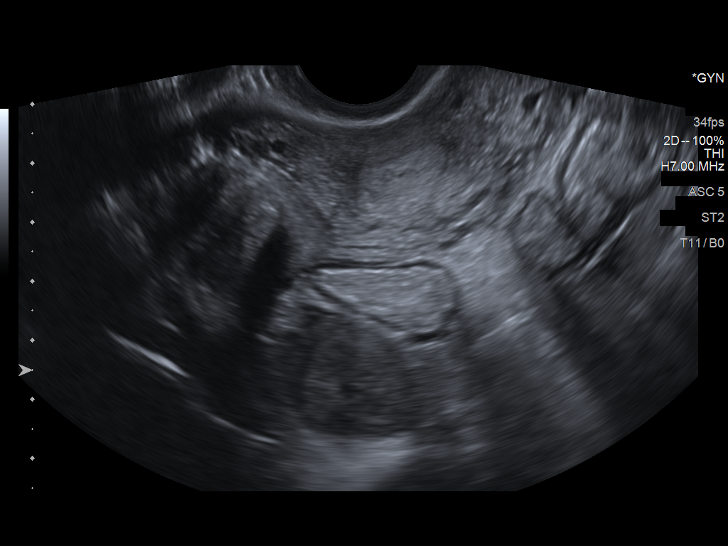
[im 51/63]
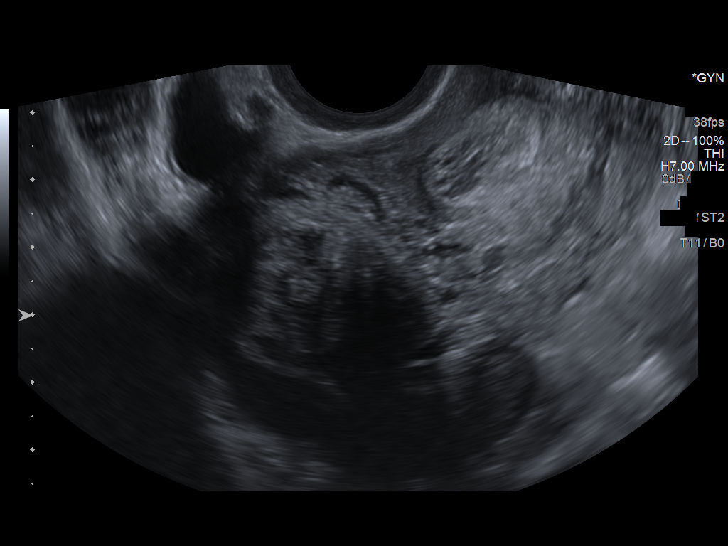
[im 56/63]
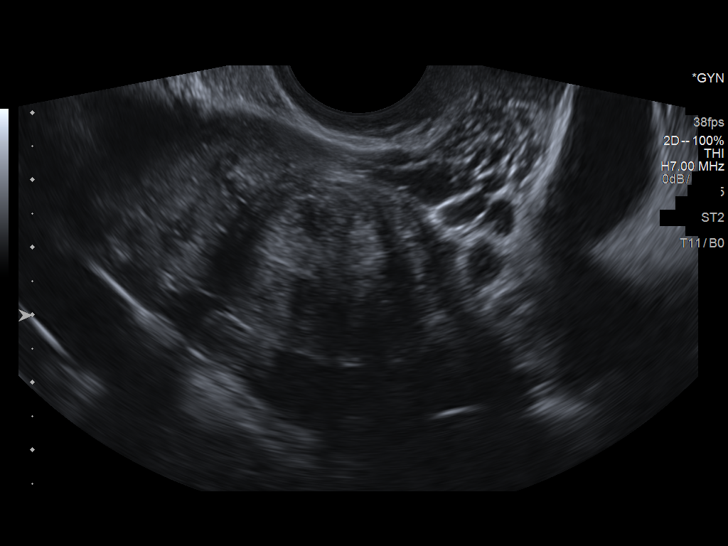
[im 60/63]
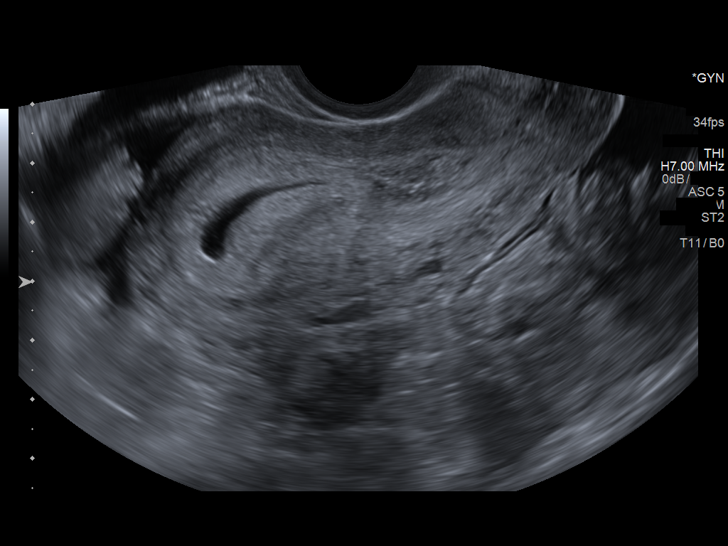

[13 of 28 positions shown; findings below may reference images not displayed]

FINDINGS: Intrauterine gestational sac: Single intrauterine gestation

Yolk sac:  Visible

Embryo:  Visible

Cardiac Activity: Visible

Heart Rate: 119 bpm

CRL:  5 mm   6 w   1 d                  US EDC: 06/29/2018

Subchorionic hemorrhage:  None visualized.

Maternal uterus/adnexae: Left ovary is not seen. The right ovary
measures 1.9 x 1.6 x 1.7 cm. Right ovary possibly contains
hemorrhagic luteal cyst. Within the right adnexa, separate from the
ovary and adjacent to the uterine body is an oval hypoechoic mass
measuring 2.9 x 2.6 x 3.1 cm.

Small fibroid within the anterior lower uterine segment measuring 6
x 5 x 8 mm. Posterior exophytic fibroid measuring 2.5 x 1.6 x
cm. Moderate fluid in the cul-de-sac.
IMPRESSION: 1. Single viable intrauterine pregnancy as above
2. Nonvisualized left ovary
3. 3.1 cm hypoechoic mass in the right adnexa with some peripheral
flow. Could possibly represent exophytic fibroid given the presence
of additional fibroids within the uterus. Complex cystic mass such
is endometrioma could also be considered.
4. Moderate free fluid in the pelvis.

## 2020-05-31 ENCOUNTER — Encounter (HOSPITAL_BASED_OUTPATIENT_CLINIC_OR_DEPARTMENT_OTHER): Payer: Self-pay

## 2020-05-31 ENCOUNTER — Other Ambulatory Visit: Payer: Self-pay

## 2020-05-31 ENCOUNTER — Emergency Department (HOSPITAL_BASED_OUTPATIENT_CLINIC_OR_DEPARTMENT_OTHER)
Admission: EM | Admit: 2020-05-31 | Discharge: 2020-05-31 | Disposition: A | Discharge status: Home / Self Care | Payer: Medicaid Other | Attending: Emergency Medicine | Admitting: Emergency Medicine

## 2020-05-31 DIAGNOSIS — B9689 Other specified bacterial agents as the cause of diseases classified elsewhere: Secondary | ICD-10-CM

## 2020-05-31 DIAGNOSIS — F17210 Nicotine dependence, cigarettes, uncomplicated: Secondary | ICD-10-CM | POA: Diagnosis not present

## 2020-05-31 DIAGNOSIS — N898 Other specified noninflammatory disorders of vagina: Secondary | ICD-10-CM | POA: Insufficient documentation

## 2020-05-31 LAB — URINALYSIS, ROUTINE W REFLEX MICROSCOPIC
Bilirubin Urine: NEGATIVE
Glucose, UA: NEGATIVE mg/dL
Hgb urine dipstick: NEGATIVE
Ketones, ur: NEGATIVE mg/dL
Leukocytes,Ua: NEGATIVE
Nitrite: NEGATIVE
Protein, ur: NEGATIVE mg/dL
Specific Gravity, Urine: 1.02 (ref 1.005–1.030)
pH: 7 (ref 5.0–8.0)

## 2020-05-31 LAB — WET PREP, GENITAL
Sperm: NONE SEEN
Trich, Wet Prep: NONE SEEN
Yeast Wet Prep HPF POC: NONE SEEN

## 2020-05-31 LAB — PREGNANCY, URINE: Preg Test, Ur: NEGATIVE

## 2020-05-31 LAB — HIV ANTIBODY (ROUTINE TESTING W REFLEX): HIV Screen 4th Generation wRfx: NONREACTIVE

## 2020-05-31 MED ORDER — METRONIDAZOLE 500 MG PO TABS: 500.0000 mg | ORAL_TABLET | Freq: Two times a day (BID) | ORAL | 0 refills | Status: DC

## 2020-05-31 NOTE — Discharge Instructions (Signed)
Please take Flagyl twice daily to treat bacterial vaginosis, do not drink alcohol while taking this antibiotic.  You have STD testing pending will be called in 2 to 3 days if this is positive is still you will need to follow-up with your OB/GYN, PCP or urgent care for treatment.  Please avoid any sexual activity until you get these results back, notify any partners if these return positive.

## 2020-05-31 NOTE — ED Notes (Signed)
She declines d/c v.s. she is ambulatory and in no distress. She has just left before final consult with our P.A.

## 2020-05-31 NOTE — ED Triage Notes (Signed)
She c/o very mild, diffuse lower abd. Discomfort, plus "whitish" vaginal d/c x 1 week. She tells me she attempted to take an over the counter oral remedy, "but I think I had an allergic reaction to it because I threw up". She is in no distress, and states she has vomited only once. She denies fever, nor any other sign of current illness.
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
## 2020-06-01 LAB — RPR
RPR Ser Ql: REACTIVE — AB
RPR Titer: 1:1 {titer}

## 2020-06-02 LAB — GC/CHLAMYDIA PROBE AMP (~~LOC~~) NOT AT ARMC
Chlamydia: NEGATIVE
Comment: NEGATIVE
Comment: NORMAL
Neisseria Gonorrhea: NEGATIVE

## 2020-06-03 LAB — T.PALLIDUM AB, TOTAL: T Pallidum Abs: NONREACTIVE

## 2020-12-18 ENCOUNTER — Encounter: Payer: Self-pay | Admitting: *Deleted

## 2022-06-02 ENCOUNTER — Inpatient Hospital Stay (HOSPITAL_COMMUNITY)
Admission: AD | Admit: 2022-06-02 | Discharge: 2022-06-05 | DRG: 786 | Disposition: A | Discharge status: Home / Self Care | Payer: Medicaid Other | Attending: Obstetrics and Gynecology | Admitting: Obstetrics and Gynecology

## 2022-06-02 ENCOUNTER — Inpatient Hospital Stay (HOSPITAL_COMMUNITY): Payer: Medicaid Other | Admitting: Certified Registered Nurse Anesthetist

## 2022-06-02 ENCOUNTER — Encounter (HOSPITAL_COMMUNITY): Payer: Self-pay | Admitting: *Deleted

## 2022-06-02 ENCOUNTER — Other Ambulatory Visit: Payer: Self-pay

## 2022-06-02 ENCOUNTER — Encounter (HOSPITAL_COMMUNITY)
Admission: AD | Disposition: A | Discharge status: Home / Self Care | Payer: Self-pay | Source: Home / Self Care | Attending: Obstetrics and Gynecology

## 2022-06-02 DIAGNOSIS — D62 Acute posthemorrhagic anemia: Secondary | ICD-10-CM | POA: Diagnosis not present

## 2022-06-02 DIAGNOSIS — Z3A38 38 weeks gestation of pregnancy: Secondary | ICD-10-CM

## 2022-06-02 DIAGNOSIS — O4593 Premature separation of placenta, unspecified, third trimester: Secondary | ICD-10-CM | POA: Diagnosis present

## 2022-06-02 DIAGNOSIS — O0933 Supervision of pregnancy with insufficient antenatal care, third trimester: Secondary | ICD-10-CM | POA: Diagnosis not present

## 2022-06-02 DIAGNOSIS — Z3A00 Weeks of gestation of pregnancy not specified: Secondary | ICD-10-CM

## 2022-06-02 DIAGNOSIS — Z30017 Encounter for initial prescription of implantable subdermal contraceptive: Secondary | ICD-10-CM | POA: Diagnosis not present

## 2022-06-02 DIAGNOSIS — O4202 Full-term premature rupture of membranes, onset of labor within 24 hours of rupture: Secondary | ICD-10-CM

## 2022-06-02 DIAGNOSIS — O4292 Full-term premature rupture of membranes, unspecified as to length of time between rupture and onset of labor: Secondary | ICD-10-CM | POA: Diagnosis present

## 2022-06-02 DIAGNOSIS — F1113 Opioid abuse with withdrawal: Secondary | ICD-10-CM | POA: Diagnosis present

## 2022-06-02 DIAGNOSIS — F17210 Nicotine dependence, cigarettes, uncomplicated: Secondary | ICD-10-CM | POA: Diagnosis present

## 2022-06-02 DIAGNOSIS — F1910 Other psychoactive substance abuse, uncomplicated: Secondary | ICD-10-CM

## 2022-06-02 DIAGNOSIS — O99324 Drug use complicating childbirth: Secondary | ICD-10-CM | POA: Diagnosis present

## 2022-06-02 DIAGNOSIS — O99334 Smoking (tobacco) complicating childbirth: Secondary | ICD-10-CM | POA: Diagnosis present

## 2022-06-02 DIAGNOSIS — Z98891 History of uterine scar from previous surgery: Secondary | ICD-10-CM

## 2022-06-02 DIAGNOSIS — O26893 Other specified pregnancy related conditions, third trimester: Secondary | ICD-10-CM | POA: Diagnosis present

## 2022-06-02 DIAGNOSIS — O9081 Anemia of the puerperium: Secondary | ICD-10-CM | POA: Diagnosis not present

## 2022-06-02 DIAGNOSIS — O321XX0 Maternal care for breech presentation, not applicable or unspecified: Secondary | ICD-10-CM

## 2022-06-02 LAB — CBC
HCT: 29.4 % — ABNORMAL LOW (ref 36.0–46.0)
HCT: 30.9 % — ABNORMAL LOW (ref 36.0–46.0)
Hemoglobin: 9.7 g/dL — ABNORMAL LOW (ref 12.0–15.0)
Hemoglobin: 9.7 g/dL — ABNORMAL LOW (ref 12.0–15.0)
MCH: 26.7 pg (ref 26.0–34.0)
MCH: 27.4 pg (ref 26.0–34.0)
MCHC: 31.4 g/dL (ref 30.0–36.0)
MCHC: 33 g/dL (ref 30.0–36.0)
MCV: 83.1 fL (ref 80.0–100.0)
MCV: 85.1 fL (ref 80.0–100.0)
Platelets: 331 10*3/uL (ref 150–400)
Platelets: 344 10*3/uL (ref 150–400)
RBC: 3.54 MIL/uL — ABNORMAL LOW (ref 3.87–5.11)
RBC: 3.63 MIL/uL — ABNORMAL LOW (ref 3.87–5.11)
RDW: 15.6 % — ABNORMAL HIGH (ref 11.5–15.5)
RDW: 15.7 % — ABNORMAL HIGH (ref 11.5–15.5)
WBC: 14.3 10*3/uL — ABNORMAL HIGH (ref 4.0–10.5)
WBC: 14.5 10*3/uL — ABNORMAL HIGH (ref 4.0–10.5)
nRBC: 0 % (ref 0.0–0.2)
nRBC: 0 % (ref 0.0–0.2)

## 2022-06-02 LAB — HEMOGLOBIN AND HEMATOCRIT, BLOOD
HCT: 19.2 % — ABNORMAL LOW (ref 36.0–46.0)
Hemoglobin: 6.3 g/dL — CL (ref 12.0–15.0)

## 2022-06-02 LAB — DIFFERENTIAL
Abs Immature Granulocytes: 0.11 10*3/uL — ABNORMAL HIGH (ref 0.00–0.07)
Basophils Absolute: 0.1 10*3/uL (ref 0.0–0.1)
Basophils Relative: 0 %
Eosinophils Absolute: 0 10*3/uL (ref 0.0–0.5)
Eosinophils Relative: 0 %
Immature Granulocytes: 1 %
Lymphocytes Relative: 20 %
Lymphs Abs: 2.9 10*3/uL (ref 0.7–4.0)
Monocytes Absolute: 2.4 10*3/uL — ABNORMAL HIGH (ref 0.1–1.0)
Monocytes Relative: 17 %
Neutro Abs: 9 10*3/uL — ABNORMAL HIGH (ref 1.7–7.7)
Neutrophils Relative %: 62 %

## 2022-06-02 LAB — RAPID URINE DRUG SCREEN, HOSP PERFORMED
Amphetamines: POSITIVE — AB
Barbiturates: NOT DETECTED
Benzodiazepines: POSITIVE — AB
Cocaine: NOT DETECTED
Opiates: POSITIVE — AB
Tetrahydrocannabinol: NOT DETECTED

## 2022-06-02 LAB — HEPATITIS C ANTIBODY: HCV Ab: NONREACTIVE

## 2022-06-02 LAB — HIV ANTIBODY (ROUTINE TESTING W REFLEX): HIV Screen 4th Generation wRfx: NONREACTIVE

## 2022-06-02 LAB — HEPATITIS B SURFACE ANTIGEN: Hepatitis B Surface Ag: NONREACTIVE

## 2022-06-02 LAB — PREPARE RBC (CROSSMATCH)

## 2022-06-02 SURGERY — Surgical Case
Anesthesia: General

## 2022-06-02 MED ORDER — ONDANSETRON HCL 4 MG/2ML IJ SOLN: 4.0000 mg | Freq: Once | INTRAMUSCULAR | Status: DC | PRN

## 2022-06-02 MED ORDER — MENTHOL 3 MG MT LOZG
1.0000 | LOZENGE | OROMUCOSAL | Status: DC | PRN
  Administered 2022-06-04: 3 mg via ORAL
  Filled 2022-06-02: qty 9

## 2022-06-02 MED ORDER — CLONIDINE HCL 0.1 MG PO TABS
0.1000 mg | ORAL_TABLET | ORAL | Status: DC
  Filled 2022-06-02: qty 1

## 2022-06-02 MED ORDER — MIDAZOLAM HCL 2 MG/2ML IJ SOLN
INTRAMUSCULAR | Status: DC | PRN
  Administered 2022-06-02: 2 mg via INTRAVENOUS

## 2022-06-02 MED ORDER — FENTANYL CITRATE (PF) 250 MCG/5ML IJ SOLN
INTRAMUSCULAR | Status: AC
  Filled 2022-06-02: qty 5

## 2022-06-02 MED ORDER — DIBUCAINE (PERIANAL) 1 % EX OINT: 1.0000 | TOPICAL_OINTMENT | CUTANEOUS | Status: DC | PRN

## 2022-06-02 MED ORDER — KETOROLAC TROMETHAMINE 30 MG/ML IJ SOLN
30.0000 mg | Freq: Four times a day (QID) | INTRAMUSCULAR | Status: AC
  Administered 2022-06-02 – 2022-06-03 (×3): 30 mg via INTRAVENOUS
  Filled 2022-06-02 (×3): qty 1

## 2022-06-02 MED ORDER — OXYTOCIN-SODIUM CHLORIDE 30-0.9 UT/500ML-% IV SOLN
INTRAVENOUS | Status: AC
  Filled 2022-06-02: qty 500

## 2022-06-02 MED ORDER — METHYLERGONOVINE MALEATE 0.2 MG/ML IJ SOLN
0.2000 mg | Freq: Once | INTRAMUSCULAR | Status: AC
  Administered 2022-06-02: 0.2 mg via INTRAMUSCULAR

## 2022-06-02 MED ORDER — FUROSEMIDE 10 MG/ML IJ SOLN
INTRAMUSCULAR | Status: DC | PRN
  Administered 2022-06-02: 10 mg via INTRAVENOUS

## 2022-06-02 MED ORDER — DEXAMETHASONE SODIUM PHOSPHATE 10 MG/ML IJ SOLN
INTRAMUSCULAR | Status: DC | PRN
  Administered 2022-06-02: 4 mg via INTRAVENOUS

## 2022-06-02 MED ORDER — ALBUMIN HUMAN 5 % IV SOLN
INTRAVENOUS | Status: AC
  Filled 2022-06-02: qty 250

## 2022-06-02 MED ORDER — WITCH HAZEL-GLYCERIN EX PADS: 1.0000 | MEDICATED_PAD | CUTANEOUS | Status: DC | PRN

## 2022-06-02 MED ORDER — SIMETHICONE 80 MG PO CHEW
80.0000 mg | CHEWABLE_TABLET | Freq: Three times a day (TID) | ORAL | Status: DC
  Administered 2022-06-03 – 2022-06-05 (×5): 80 mg via ORAL
  Filled 2022-06-02 (×7): qty 1

## 2022-06-02 MED ORDER — HYDROXYZINE HCL 50 MG PO TABS: 25.0000 mg | ORAL_TABLET | Freq: Four times a day (QID) | ORAL | Status: DC | PRN

## 2022-06-02 MED ORDER — IBUPROFEN 800 MG PO TABS: 800.0000 mg | ORAL_TABLET | Freq: Three times a day (TID) | ORAL | Status: DC

## 2022-06-02 MED ORDER — SODIUM CHLORIDE 0.9% IV SOLUTION: Freq: Once | INTRAVENOUS | Status: DC

## 2022-06-02 MED ORDER — OXYTOCIN-SODIUM CHLORIDE 30-0.9 UT/500ML-% IV SOLN: 2.5000 [IU]/h | INTRAVENOUS | Status: AC

## 2022-06-02 MED ORDER — ONDANSETRON HCL 4 MG/2ML IJ SOLN
INTRAMUSCULAR | Status: DC | PRN
  Administered 2022-06-02: 4 mg via INTRAVENOUS

## 2022-06-02 MED ORDER — ZOLPIDEM TARTRATE 5 MG PO TABS: 5.0000 mg | ORAL_TABLET | Freq: Every evening | ORAL | Status: DC | PRN

## 2022-06-02 MED ORDER — OXYCODONE-ACETAMINOPHEN 5-325 MG PO TABS
2.0000 | ORAL_TABLET | ORAL | Status: DC | PRN
  Administered 2022-06-04 – 2022-06-05 (×3): 2 via ORAL
  Filled 2022-06-02 (×2): qty 2

## 2022-06-02 MED ORDER — CEFAZOLIN SODIUM-DEXTROSE 2-4 GM/100ML-% IV SOLN
INTRAVENOUS | Status: AC
  Filled 2022-06-02: qty 100

## 2022-06-02 MED ORDER — DIPHENHYDRAMINE HCL 25 MG PO CAPS: 25.0000 mg | ORAL_CAPSULE | Freq: Four times a day (QID) | ORAL | Status: DC | PRN

## 2022-06-02 MED ORDER — ONDANSETRON HCL 4 MG/2ML IJ SOLN
INTRAMUSCULAR | Status: AC
  Filled 2022-06-02: qty 2

## 2022-06-02 MED ORDER — TRANEXAMIC ACID-NACL 1000-0.7 MG/100ML-% IV SOLN
INTRAVENOUS | Status: AC
  Filled 2022-06-02: qty 100

## 2022-06-02 MED ORDER — HYDROMORPHONE HCL 1 MG/ML IJ SOLN: 1.0000 mg | INTRAMUSCULAR | Status: DC | PRN

## 2022-06-02 MED ORDER — OXYCODONE-ACETAMINOPHEN 5-325 MG PO TABS
1.0000 | ORAL_TABLET | ORAL | Status: DC | PRN
  Administered 2022-06-03 – 2022-06-04 (×3): 1 via ORAL
  Filled 2022-06-02 (×2): qty 1
  Filled 2022-06-02: qty 2
  Filled 2022-06-02: qty 1

## 2022-06-02 MED ORDER — ALBUMIN HUMAN 5 % IV SOLN: INTRAVENOUS | Status: DC | PRN

## 2022-06-02 MED ORDER — NAPROXEN 250 MG PO TABS: 500.0000 mg | ORAL_TABLET | Freq: Two times a day (BID) | ORAL | Status: DC | PRN

## 2022-06-02 MED ORDER — LOPERAMIDE HCL 2 MG PO CAPS: 2.0000 mg | ORAL_CAPSULE | ORAL | Status: DC | PRN

## 2022-06-02 MED ORDER — HYDROMORPHONE HCL 1 MG/ML IJ SOLN: 0.2500 mg | INTRAMUSCULAR | Status: DC | PRN

## 2022-06-02 MED ORDER — DEXMEDETOMIDINE HCL IN NACL 80 MCG/20ML IV SOLN
INTRAVENOUS | Status: DC | PRN
  Administered 2022-06-02: 20 ug via BUCCAL

## 2022-06-02 MED ORDER — COCONUT OIL OIL: 1.0000 | TOPICAL_OIL | Status: DC | PRN

## 2022-06-02 MED ORDER — LIDOCAINE HCL (CARDIAC) PF 100 MG/5ML IV SOSY
PREFILLED_SYRINGE | INTRAVENOUS | Status: DC | PRN
  Administered 2022-06-02: 100 mg via INTRAVENOUS

## 2022-06-02 MED ORDER — ACETAMINOPHEN 10 MG/ML IV SOLN
INTRAVENOUS | Status: AC
  Filled 2022-06-02: qty 100

## 2022-06-02 MED ORDER — MIDAZOLAM HCL 2 MG/2ML IJ SOLN
INTRAMUSCULAR | Status: AC
  Filled 2022-06-02: qty 2

## 2022-06-02 MED ORDER — PRENATAL MULTIVITAMIN CH
1.0000 | ORAL_TABLET | Freq: Every day | ORAL | Status: DC
  Administered 2022-06-03 – 2022-06-05 (×2): 1 via ORAL
  Filled 2022-06-02 (×3): qty 1

## 2022-06-02 MED ORDER — FENTANYL CITRATE (PF) 100 MCG/2ML IJ SOLN
INTRAMUSCULAR | Status: DC | PRN
  Administered 2022-06-02: 250 ug via INTRAVENOUS

## 2022-06-02 MED ORDER — DICYCLOMINE HCL 20 MG PO TABS: 20.0000 mg | ORAL_TABLET | Freq: Four times a day (QID) | ORAL | Status: DC | PRN

## 2022-06-02 MED ORDER — PENICILLIN G POT IN DEXTROSE 60000 UNIT/ML IV SOLN
3.0000 10*6.[IU] | INTRAVENOUS | Status: DC
  Filled 2022-06-02 (×3): qty 50

## 2022-06-02 MED ORDER — ONDANSETRON 4 MG PO TBDP: 4.0000 mg | ORAL_TABLET | Freq: Four times a day (QID) | ORAL | Status: DC | PRN

## 2022-06-02 MED ORDER — METHOCARBAMOL 500 MG PO TABS: 500.0000 mg | ORAL_TABLET | Freq: Three times a day (TID) | ORAL | Status: DC | PRN

## 2022-06-02 MED ORDER — CLONIDINE HCL 0.1 MG PO TABS
0.1000 mg | ORAL_TABLET | Freq: Every day | ORAL | Status: DC
  Filled 2022-06-02: qty 1

## 2022-06-02 MED ORDER — SENNOSIDES-DOCUSATE SODIUM 8.6-50 MG PO TABS
2.0000 | ORAL_TABLET | Freq: Every day | ORAL | Status: DC
  Administered 2022-06-05: 2 via ORAL
  Filled 2022-06-02 (×2): qty 2

## 2022-06-02 MED ORDER — LACTATED RINGERS IV SOLN: INTRAVENOUS | Status: DC | PRN

## 2022-06-02 MED ORDER — PROPOFOL 10 MG/ML IV BOLUS
INTRAVENOUS | Status: DC | PRN
  Administered 2022-06-02: 120 mg via INTRAVENOUS

## 2022-06-02 MED ORDER — FUROSEMIDE 10 MG/ML IJ SOLN
INTRAMUSCULAR | Status: AC
  Filled 2022-06-02: qty 2

## 2022-06-02 MED ORDER — OXYCODONE HCL 5 MG/5ML PO SOLN: 5.0000 mg | Freq: Once | ORAL | Status: DC | PRN

## 2022-06-02 MED ORDER — CEFAZOLIN SODIUM-DEXTROSE 2-3 GM-%(50ML) IV SOLR
INTRAVENOUS | Status: DC | PRN
  Administered 2022-06-02: 2 g via INTRAVENOUS

## 2022-06-02 MED ORDER — TETANUS-DIPHTH-ACELL PERTUSSIS 5-2.5-18.5 LF-MCG/0.5 IM SUSY: 0.5000 mL | PREFILLED_SYRINGE | Freq: Once | INTRAMUSCULAR | Status: DC

## 2022-06-02 MED ORDER — SOD CITRATE-CITRIC ACID 500-334 MG/5ML PO SOLN
30.0000 mL | ORAL | Status: AC
  Administered 2022-06-02: 30 mL via ORAL
  Filled 2022-06-02: qty 30

## 2022-06-02 MED ORDER — NALOXONE HCL 0.4 MG/ML IJ SOLN
INTRAMUSCULAR | Status: AC
  Filled 2022-06-02: qty 1

## 2022-06-02 MED ORDER — CLONIDINE HCL 0.1 MG PO TABS
0.1000 mg | ORAL_TABLET | Freq: Four times a day (QID) | ORAL | Status: DC
  Administered 2022-06-03 – 2022-06-05 (×9): 0.1 mg via ORAL
  Filled 2022-06-02 (×12): qty 1

## 2022-06-02 MED ORDER — DEXAMETHASONE SODIUM PHOSPHATE 4 MG/ML IJ SOLN
INTRAMUSCULAR | Status: AC
  Filled 2022-06-02: qty 1

## 2022-06-02 MED ORDER — TRANEXAMIC ACID-NACL 1000-0.7 MG/100ML-% IV SOLN
INTRAVENOUS | Status: DC | PRN
  Administered 2022-06-02: 1000 mg via INTRAVENOUS

## 2022-06-02 MED ORDER — SODIUM CHLORIDE 0.9 % IV SOLN
500.0000 mg | INTRAVENOUS | Status: AC
  Administered 2022-06-02: 500 mg via INTRAVENOUS
  Filled 2022-06-02: qty 5

## 2022-06-02 MED ORDER — OXYCODONE HCL 5 MG PO TABS: 5.0000 mg | ORAL_TABLET | Freq: Once | ORAL | Status: DC | PRN

## 2022-06-02 MED ORDER — KETOROLAC TROMETHAMINE 30 MG/ML IJ SOLN: 30.0000 mg | Freq: Once | INTRAMUSCULAR | Status: DC | PRN

## 2022-06-02 MED ORDER — SIMETHICONE 80 MG PO CHEW: 80.0000 mg | CHEWABLE_TABLET | ORAL | Status: DC | PRN

## 2022-06-02 MED ORDER — ACETAMINOPHEN 10 MG/ML IV SOLN
INTRAVENOUS | Status: DC | PRN
  Administered 2022-06-02: 1000 mg via INTRAVENOUS

## 2022-06-02 MED ORDER — METHYLERGONOVINE MALEATE 0.2 MG/ML IJ SOLN
INTRAMUSCULAR | Status: AC
  Filled 2022-06-02: qty 1

## 2022-06-02 MED ORDER — LACTATED RINGERS IV BOLUS
1000.0000 mL | Freq: Once | INTRAVENOUS | Status: AC
  Administered 2022-06-02: 1000 mL via INTRAVENOUS

## 2022-06-02 MED ORDER — SUCCINYLCHOLINE CHLORIDE 200 MG/10ML IV SOSY
PREFILLED_SYRINGE | INTRAVENOUS | Status: DC | PRN
  Administered 2022-06-02: 100 mg via INTRAVENOUS

## 2022-06-02 MED ORDER — OXYTOCIN-SODIUM CHLORIDE 30-0.9 UT/500ML-% IV SOLN
INTRAVENOUS | Status: DC | PRN
  Administered 2022-06-02: 200 mL via INTRAVENOUS

## 2022-06-02 MED ORDER — PHENYLEPHRINE HCL (PRESSORS) 10 MG/ML IV SOLN
INTRAVENOUS | Status: DC | PRN
  Administered 2022-06-02 (×2): 160 ug via INTRAVENOUS
  Administered 2022-06-02: 80 ug via INTRAVENOUS

## 2022-06-02 MED ORDER — SODIUM CHLORIDE 0.9 % IV SOLN: 5.0000 10*6.[IU] | Freq: Once | INTRAVENOUS | Status: DC

## 2022-06-02 SURGICAL SUPPLY — 37 items
BENZOIN TINCTURE PRP APPL 2/3 (GAUZE/BANDAGES/DRESSINGS) ×1 IMPLANT
CHLORAPREP W/TINT 26ML (MISCELLANEOUS) ×2 IMPLANT
CLAMP UMBILICAL CORD (MISCELLANEOUS) ×1 IMPLANT
CLOTH BEACON ORANGE TIMEOUT ST (SAFETY) ×1 IMPLANT
DRAPE C SECTION CLR SCREEN (DRAPES) IMPLANT
DRSG OPSITE POSTOP 4X10 (GAUZE/BANDAGES/DRESSINGS) ×1 IMPLANT
ELECT REM PT RETURN 9FT ADLT (ELECTROSURGICAL) ×1
ELECTRODE REM PT RTRN 9FT ADLT (ELECTROSURGICAL) ×1 IMPLANT
EXTRACTOR VACUUM M CUP 4 TUBE (SUCTIONS) IMPLANT
GLOVE BIO SURGEON STRL SZ7.5 (GLOVE) ×1 IMPLANT
GLOVE BIOGEL PI IND STRL 7.0 (GLOVE) ×2 IMPLANT
GOWN STRL REUS W/TWL 2XL LVL3 (GOWN DISPOSABLE) ×1 IMPLANT
GOWN STRL REUS W/TWL LRG LVL3 (GOWN DISPOSABLE) ×2 IMPLANT
KIT ABG SYR 3ML LUER SLIP (SYRINGE) IMPLANT
NDL HYPO 25X5/8 SAFETYGLIDE (NEEDLE) IMPLANT
NEEDLE HYPO 22GX1.5 SAFETY (NEEDLE) ×1 IMPLANT
NEEDLE HYPO 25X5/8 SAFETYGLIDE (NEEDLE) IMPLANT
NS IRRIG 1000ML POUR BTL (IV SOLUTION) ×1 IMPLANT
PACK C SECTION WH (CUSTOM PROCEDURE TRAY) ×1 IMPLANT
PAD OB MATERNITY 4.3X12.25 (PERSONAL CARE ITEMS) ×1 IMPLANT
RTRCTR C-SECT PINK 25CM LRG (MISCELLANEOUS) ×1 IMPLANT
STRIP CLOSURE SKIN 1/2X4 (GAUZE/BANDAGES/DRESSINGS) ×1 IMPLANT
SUT CHROMIC 1 CTX 36 (SUTURE) ×2 IMPLANT
SUT PLAIN 0 NONE (SUTURE) IMPLANT
SUT VIC AB 1 CT1 36 (SUTURE) ×1 IMPLANT
SUT VIC AB 2-0 CT1 (SUTURE) ×1 IMPLANT
SUT VIC AB 2-0 CT1 27 (SUTURE) ×1
SUT VIC AB 2-0 CT1 TAPERPNT 27 (SUTURE) ×1 IMPLANT
SUT VIC AB 3-0 CT1 27 (SUTURE) ×1
SUT VIC AB 3-0 CT1 TAPERPNT 27 (SUTURE) ×1 IMPLANT
SUT VIC AB 3-0 SH 27 (SUTURE)
SUT VIC AB 3-0 SH 27X BRD (SUTURE) IMPLANT
SUT VIC AB 4-0 KS 27 (SUTURE) ×1 IMPLANT
SYR BULB IRRIGATION 50ML (SYRINGE) IMPLANT
TOWEL OR 17X24 6PK STRL BLUE (TOWEL DISPOSABLE) ×1 IMPLANT
TRAY FOLEY W/BAG SLVR 14FR LF (SET/KITS/TRAYS/PACK) ×1 IMPLANT
WATER STERILE IRR 1000ML POUR (IV SOLUTION) ×1 IMPLANT

## 2022-06-02 NOTE — MAU Note (Addendum)
...  April Boyd is a 31 y.o. at Unknown here in MAU reporting: Unknown gestational age presenting with an urge to push. Upon presentation to MAU patient uncontrollable and thrashing around violently in the chair and stretcher.   Light meconium stained fluids noted. 2019 UDS in Epic positive for THC and Methamphetamine use.  Unable to obtain VS. Patient unable to stay still in stretcher. Difficult to trace FHT. Maryelizabeth Kaufmann, CNM, at bedside immediately.   Onset of complaint: Unsure. "Today."  Pain score: Unable to specify.

## 2022-06-02 NOTE — Anesthesia Postprocedure Evaluation (Signed)
Anesthesia Post Note  Patient: April Boyd  Procedure(s) Performed: [place]     Patient location during evaluation: PACU Anesthesia Type: General Level of consciousness: awake and alert Pain management: pain level controlled Vital Signs Assessment: post-procedure vital signs reviewed and stable Respiratory status: spontaneous breathing, nonlabored ventilation, respiratory function stable and patient connected to nasal cannula oxygen Cardiovascular status: blood pressure returned to baseline and stable Postop Assessment: no apparent nausea or vomiting Anesthetic complications: no  No notable events documented.  Last Vitals:  Vitals:   06/02/22 2115 06/02/22 2130  BP:  (!) 102/57  Pulse: (!) 113 (!) 102  Resp: (!) 21 (!) 23  Temp:    SpO2: 100% 92%    Last Pain:  Vitals:   06/02/22 1909  TempSrc: Oral   Pain Goal:    LLE Motor Response: Purposeful movement (06/02/22 2130) LLE Sensation: Full sensation (06/02/22 2130) RLE Motor Response: Purposeful movement (06/02/22 2130) RLE Sensation: Full sensation (06/02/22 2130)     Epidural/Spinal Function Cutaneous sensation: Normal sensation (06/02/22 2045), Patient able to flex knees: Yes (06/02/22 2045), Patient able to lift hips off bed: Yes (06/02/22 2045), Back pain beyond tenderness at insertion site: No (06/02/22 2045), Progressively worsening motor and/or sensory loss: No (06/02/22 2045), Bowel and/or bladder incontinence post epidural: No (06/02/22 2045)  Barnet Glasgow

## 2022-06-02 NOTE — Op Note (Signed)
Cesarean Section Procedure Note  06/02/2022  8:39 PM  PATIENT:  April Boyd  31 y.o. female  PRE-OPERATIVE DIAGNOSIS:  code ceserean, breech, labor  POST-OPERATIVE DIAGNOSIS:  breech, labor  PROCEDURE:  Procedure(s): CESAREAN SECTION (N/A)  SURGEON:  Surgeon(s) and Role:    * Chancy Milroy, MD - Primary  ASSISTANTS: none   ANESTHESIA:   general  EBL:  Total I/O In: 2250 [I.V.:2000; IV Piggyback:250] Out: 5035 [Urine:25; Blood:1210]  BLOOD ADMINISTERED:none   DISPOSITION OF SPECIMEN:   Placenta to pathology   Procedure Details   See admitting H & P for information for need and decision regarding c section.    PROCEDURE IN DETAIL:  The patient was urgently taken to the the operating room and she was then placed in a dorsal supine position with a leftward tilt. Due to pt withdrawing, GETA was admistered. Foley was placed and she was  prepped quickly with betadine and draped in a sterile manner. A Pfannenstiel skin incision was made with scalpel and carried through to the underlying layer of fascia. The fascial incision was extended bilaterally in a blunt fashion.  The fascia was separated from underlying rectus muscles bluntly.  The rectus muscles were separated in the midline bluntly and the peritoneum was entered bluntly. Attention was turned to the lower uterine segment where a low transverse hysterotomy was made with a scalpel and extended bilaterally bluntly.  The infant was successfully delivered, the cord was clamped and cut and the infant was handed over to awaiting neonatology team. Cord blood and cord gas were obtained. The placenta was delivered intact and had a three-vessel cord. The uterus was then cleared of clot and debris.  The hysterotomy was closed with 0 Chromic in a running locked fashion, and an imbricating layer was also placed with 0 Chromic.  The pelvis was cleared of all clot and debris. Hemostasis was confirmed on all surfaces. The peritoneum and  rectus muscles were reappromixated with 2/0 Vicryl.   The fascia was then closed using 0 Vicryl in a running fashion.  The subcutaneous layer was closed with 3/0 Vicryl.   The skin was closed with a 4-0 Vicryl subcuticular stitch. The patient tolerated the procedure well. Sponge, lap, instrument and needle counts were correct x 2.  She was taken to the recovery room in stable condition.       Chancy Milroy, MD 06/02/2022 8:39 PM

## 2022-06-02 NOTE — Progress Notes (Signed)
History     [id number]  Arrival date and time: 06/02/22 1854    No chief complaint on file.    HPI April Boyd is a 31 y.o. at term who presents for active labor and ROM as she was arriving to the [facility] traffic circle.  She reports 10/10 pain on arrival to MAU accompanied by urge to push.  Patient endorses receiving services from the methadone clinic. She is unable to state if she has used any other substances today.  Patient reports receiving prenatal care with [facility] x 2 visits.   OB History     Gravida  2   Para  1   Term  1   Preterm  0   AB  0   Living  1      SAB  0   IAB  0   Ectopic  0   Multiple  0   Live Births  1           Past Medical History:  Diagnosis Date   Drug use affecting pregnancy    Medical history non-contributory     Past Surgical History:  Procedure Laterality Date   NO PAST SURGERIES      Family History  Problem Relation Age of Onset   Hypertension Father     No Known Allergies  No current facility-administered medications on file prior to encounter.   Current Outpatient Medications on File Prior to Encounter  Medication Sig Dispense Refill   ibuprofen (ADVIL,MOTRIN) 600 MG tablet Take 1 tablet (600 mg total) by mouth every 6 (six) hours. 30 tablet 0   metroNIDAZOLE (FLAGYL) 500 MG tablet Take 1 tablet (500 mg total) by mouth 2 (two) times daily. One po bid x 7 days 14 tablet 0   Prenatal Vit-Fe Fumarate-FA (PRENATAL VITAMIN PO) Take by mouth.       Review of Systems  Gastrointestinal:  Positive for abdominal pain.  All other systems reviewed and are negative.  Pertinent positives and negative per HPI, all others reviewed and negative  Physical Exam   There were no vitals taken for this visit.  Patient Vitals for the past 24 hrs:  Temp src  06/02/22 1909 Oral    Physical Exam Vitals and nursing note reviewed. Exam conducted with a chaperone present.  Constitutional:      General: She is in acute  distress.  Skin:    Capillary Refill: Capillary refill takes less than 2 seconds.  Neurological:     Mental Status: She is alert. She is disoriented.  Psychiatric:     Comments: Patient agitated, unable to reduce physical movement, groping herself on initial arrival to MAU. Able to answer short questions appropriately and identify staff by name.       Cervical Exam Dilation: 4 Effacement (%): 70 Station: -2 Presentation: Undeterminable Exam by:: ervin MD  Bedside Ultrasound --Breech presentation, initial exam performed by Jeronimo Greaves, CNM --Confirmatory second look for presentation performed by Robyne Askew, FNP  FHT: patient unable to hold still for application of fetal monitors. Initial FHT assessed with BSUS Baseline Indeterminate, FHT 140s with BSUS Toco: q 2 min, palpate strong Cat: Unable to assess  Labs Results for orders placed or performed during the hospital encounter of 06/02/22 (from the past 24 hour(s))  CBC     Status: Abnormal   Collection Time: 06/02/22  7:04 PM  Result Value Ref Range   WBC 14.3 (H) 4.0 - 10.5 K/uL   RBC 3.63 (L) 3.87 -  5.11 MIL/uL   Hemoglobin 9.7 (L) 12.0 - 15.0 g/dL   HCT 30.9 (L) 36.0 - 46.0 %   MCV 85.1 80.0 - 100.0 fL   MCH 26.7 26.0 - 34.0 pg   MCHC 31.4 30.0 - 36.0 g/dL   RDW 15.7 (H) 11.5 - 15.5 %   Platelets 344 150 - 400 K/uL   nRBC 0.0 0.0 - 0.2 %  Type and screen     Status: None   Collection Time: 06/02/22  7:04 PM  Result Value Ref Range   ABO/RH(D) O POS    Antibody Screen NEG    Sample Expiration      06/05/2022,2359 Performed at [facility], [place] [address] [place], [place] [address]     Imaging No results found.  MAU Course  Procedures  Lab Orders         CBC         RPR         HIV Antibody (routine testing w rflx)         Rubella screen         Hepatitis B surface antigen         Hepatitis C antibody         Rapid urine drug screen (hospital performed)         Differential         CBC    Meds  ordered this encounter  Medications   lactated ringers bolus 1,000 mL   azithromycin (ZITHROMAX) 500 mg in sodium chloride 0.9 % 250 mL IVPB    Order Specific Question:   Indication:    Answer:   Surgical Prophylaxis   sodium citrate-citric acid (ORACIT) solution 30 mL   FOLLOWED BY Linked Order Group    penicillin G potassium 5 Million Units in sodium chloride 0.9 % 250 mL IVPB     Order Specific Question:   Antibiotic Indication:     Answer:   Group B Strep Prophylaxis    penicillin G potassium 3 Million Units in dextrose 36m IVPB     Order Specific Question:   Antibiotic Indication:     Answer:   Group B Strep Prophylaxis   Imaging Orders  No imaging studies ordered today    MDM --Grossly ruptured with meconium stained fluid --FH 37cm per my assessment --Breech presentation via BSUS --Unable to visualize prenatal records in chart  Assessment and Plan  --31y.o. G2P1001 with ROM, FH 37cm --Meconium-stained fluid --Concern for active withdrawal on arrival and throughout encounter --Dr. ERip Harbouron unit for assessment --Patient unable to participate in medical decision-making --Patient assisted to L&D by CNM for additional assessment   SDarlina Rumpf CNM 06/02/22 8:20 PM

## 2022-06-02 NOTE — H&P (Signed)
Joanne Salah is a 31 y.o. female G2P1001 presenting for c/o ut ctx and SROM just prior to arrival. No prenatal care. H/O substance abuse. Appears active high on presentation. OB History     Gravida  2   Para  1   Term  1   Preterm  0   AB  0   Living  1      SAB  0   IAB  0   Ectopic  0   Multiple  0   Live Births  1          Past Medical History:  Diagnosis Date   Drug use affecting pregnancy    Medical history non-contributory    Past Surgical History:  Procedure Laterality Date   NO PAST SURGERIES     Family History: family history includes Hypertension in her father. Social History:  reports that she has been smoking. She has been smoking an average of .25 packs per day. She has never used smokeless tobacco. She reports that she does not currently use alcohol. She reports current drug use. Drugs: Marijuana and Heroin.      Review of Systems History Dilation: 4 Effacement (%): 70 Station: -2 Exam by:: Mariany Mackintosh MD There were no vitals taken for this visit. Exam Physical Exam Constitutional:      Comments: Very agitated, appears high  Cardiovascular:     Rate and Rhythm: Tachycardia present.  Pulmonary:     Breath sounds: Normal breath sounds.  Abdominal:     Palpations: Abdomen is soft.     Comments: FH 37 cm  Genitourinary:    Comments: SVE as documented    Prenatal labs: ABO, Rh: --/--/O POS (11/08 1904) Antibody: NEG (11/08 1904) Rubella:   RPR:    HBsAg:    HIV:    GBS:     Assessment/Plan: IUP ? Gestational age, 61 by FH Actively withdrawing form substance abuse  SROM Malpresentation   Due to actively withdrawing, SROM and malpresentation, feel that delivery is indicated. Do not feel that pt can safely delivery vaginal. C section, I feel is the best and safest approach. C section discussed with pt, R/B reviewed, pt verbalized understanding. Consent was witnessed by nursing staff, present in the room. Discussed with  anesthesia will proceed to OR in emergent manner. Discussed with NICU attending as well. To OR.    Chancy Milroy 06/02/2022, 8:29 PM

## 2022-06-02 NOTE — MAU Note (Addendum)
Fundal Height measuring 37 weeks per Maryelizabeth Kaufmann, CNM.

## 2022-06-02 NOTE — Transfer of Care (Signed)
Immediate Anesthesia Transfer of Care Note  Patient: April Boyd  Procedure(s) Performed: CESAREAN SECTION  Patient Location: PACU  Anesthesia Type:General  Level of Consciousness: sedated, drowsy, and patient cooperative  Airway & Oxygen Therapy: Patient Spontanous Breathing and Patient connected to face mask oxygen  Post-op Assessment: Report given to RN and Post -op Vital signs reviewed and stable  Post vital signs: Reviewed and stable  Last Vitals:  Vitals Value Taken Time  BP    Temp    Pulse    Resp    SpO2      Last Pain:  Vitals:   06/02/22 1909  TempSrc: Oral         Complications: No notable events documented.

## 2022-06-02 NOTE — Progress Notes (Signed)
CRITICAL RESULT PROVIDER NOTIFICATION  Test performed and critical result:    Date and time result received:  11/08 @ 2220  Provider name/title: dr Rip Harbour   Date and time provider notified: dr Rip Harbour 11/08 '@2223'$   Date and time provider responded:   Provider response:See new orders   Md to enter orders for 3u PRBC

## 2022-06-02 NOTE — Anesthesia Preprocedure Evaluation (Signed)
Anesthesia Evaluation  Preop documentation limited or incomplete due to emergent nature of procedure.  Airway        Dental   Pulmonary Current Smoker          Cardiovascular      Neuro/Psych    GI/Hepatic ,,,(+)     substance abuse    Endo/Other    Renal/GU      Musculoskeletal   Abdominal   Peds  Hematology Lab Results      Component                Value               Date                      WBC                      14.3 (H)            06/02/2022                HGB                      9.7 (L)             06/02/2022                HCT                      30.9 (L)            06/02/2022                MCV                      85.1                06/02/2022                PLT                      344                 06/02/2022          \    Anesthesia Other Findings   Reproductive/Obstetrics (+) Pregnancy                             Anesthesia Physical Anesthesia Plan  ASA: 3 and emergent  Anesthesia Plan: General   Post-op Pain Management:    Induction: Intravenous, Rapid sequence and Cricoid pressure planned  PONV Risk Score and Plan: 3 and Treatment may vary due to age or medical condition, Ondansetron, Dexamethasone and Midazolam  Airway Management Planned: Oral ETT and Video Laryngoscope Planned  Additional Equipment: None  Intra-op Plan:   Post-operative Plan: Extubation in OR  Informed Consent:      Dental advisory given  Plan Discussed with: CRNA and Anesthesiologist  Anesthesia Plan Comments: (Pt appears to be inebriated and combative. Hx of Drug abuse  for eMERGENT c/s per Dr Rip Harbour)        Anesthesia Quick Evaluation

## 2022-06-02 NOTE — Anesthesia Procedure Notes (Signed)
Procedure Name: Intubation Date/Time: 06/02/2022 7:54 PM  Performed by: Barnet Glasgow, MDPre-anesthesia Checklist: Patient identified, Emergency Drugs available, Suction available and Patient being monitored Patient Re-evaluated:Patient Re-evaluated prior to induction Oxygen Delivery Method: Circle system utilized Preoxygenation: Pre-oxygenation with 100% oxygen Induction Type: Rapid sequence and Cricoid Pressure applied Laryngoscope Size: Glidescope and 3 Tube type: Oral Tube size: 7.0 mm Number of attempts: 1 Airway Equipment and Method: Video-laryngoscopy and Rigid stylet Placement Confirmation: ETT inserted through vocal cords under direct vision, positive ETCO2 and breath sounds checked- equal and bilateral Secured at: 21 cm Tube secured with: Tape Dental Injury: Teeth and Oropharynx as per pre-operative assessment

## 2022-06-03 ENCOUNTER — Encounter (HOSPITAL_COMMUNITY): Payer: Self-pay | Admitting: Obstetrics and Gynecology

## 2022-06-03 LAB — CBC
HCT: 30.4 % — ABNORMAL LOW (ref 36.0–46.0)
Hemoglobin: 10 g/dL — ABNORMAL LOW (ref 12.0–15.0)
MCH: 28.1 pg (ref 26.0–34.0)
MCHC: 32.9 g/dL (ref 30.0–36.0)
MCV: 85.4 fL (ref 80.0–100.0)
Platelets: 220 10*3/uL (ref 150–400)
RBC: 3.56 MIL/uL — ABNORMAL LOW (ref 3.87–5.11)
RDW: 15.2 % (ref 11.5–15.5)
WBC: 18.9 10*3/uL — ABNORMAL HIGH (ref 4.0–10.5)
nRBC: 0.2 % (ref 0.0–0.2)

## 2022-06-03 LAB — RPR: RPR Ser Ql: NONREACTIVE

## 2022-06-03 LAB — RUBELLA SCREEN: Rubella: 19.9 index (ref >0.99)

## 2022-06-03 MED ORDER — IBUPROFEN 800 MG PO TABS
800.0000 mg | ORAL_TABLET | Freq: Three times a day (TID) | ORAL | Status: DC
  Administered 2022-06-03 – 2022-06-04 (×2): 800 mg via ORAL
  Filled 2022-06-03 (×3): qty 1

## 2022-06-03 NOTE — Progress Notes (Signed)
Bladder scanned 175m.

## 2022-06-03 NOTE — Progress Notes (Signed)
CSW acknowledges consult and completed clinical assessment.  Clinical documentation will follow.  There are barriers to infant's d/c. [facility] CPS report made to P. Miller.   Laurey Arrow, MSW, LCSW Clinical Social Work [phone]

## 2022-06-03 NOTE — Lactation Note (Signed)
This note was copied from a baby's chart. Lactation Consultation Note  Patient Name: April Boyd [id number] Date: 06/03/2022   Age:32 hours  Lactation attempted to see parent on [place] earlier today. Parent was not present in room (possibly in NICU). Lactation followed up with [facility] RN.

## 2022-06-03 NOTE — Progress Notes (Signed)
POD1 s/p 1LTCS due to breech presentation d/t PROM.   Subjective: no complaints, up ad lib, voiding, and tolerating PO  Objective: Blood pressure (!) 100/53, pulse 88, temperature 97.8 F (36.6 C), temperature source Oral, resp. rate 19, SpO2 100 %, unknown if currently breastfeeding.  Physical Exam:  General: alert, cooperative, and no distress Lochia: appropriate Uterine Fundus: firm Incision: healing well DVT Evaluation: No significant calf/ankle edema.  Recent Labs    06/02/22 1926 06/02/22 2120  HGB 9.7* 6.3*  HCT 29.4* 19.2*    Assessment/Plan: Postpartum/Postop - Pain is well controlled and she is meeting all postop goals - Contraception: She would like BTS ultimately. Will sign  Medicaid state forms today and reviewed interval salpingectomy. Reviewed the permanent nature and non-reversible nature of BTS. She would like this procedure if possible. Reviewed must wait 30 days. In the interim, she is thinking she may want the Nexplanon for birth control until that time. Counseled on all options of birth control as well. She will let me know tomorrow.  - MOF: Formula and breast - Rh status: Positive - Rubella status: Immune - Dispo: Tomorrow or POD3 - Consults: Lactation, SW  Acute blood loss anemia due to postpartum hemorrhage - Intra-op HgB was 6.3, she is just finishing her 3rd unit PRBC - Will check post-transfusion HgB  Polysubstance abuse - On withdrawal protocols for overnight. Dr. Dione Plover to discuss with patient this afternoon.  - Urine tox positive for opiates, Bzs, Amphetamines  No prenatal care - SW consulted - PNL: O+/-, RI, NRx4  Neonatal - In NICU - Circumcision: Does desire circ. Did not review consent yet today as baby is still in the NICU.    LOS: 1 day   Radene Gunning 06/03/2022, 10:29 AM

## 2022-06-04 ENCOUNTER — Other Ambulatory Visit (HOSPITAL_COMMUNITY): Payer: Self-pay

## 2022-06-04 ENCOUNTER — Encounter (HOSPITAL_COMMUNITY): Payer: Self-pay | Admitting: Obstetrics and Gynecology

## 2022-06-04 ENCOUNTER — Other Ambulatory Visit: Payer: Self-pay

## 2022-06-04 LAB — BPAM RBC
Blood Product Expiration Date: 202312022359
Blood Product Expiration Date: 202312032359
Blood Product Expiration Date: 202312032359
ISSUE DATE / TIME: 202311082354
ISSUE DATE / TIME: 202311090301
ISSUE DATE / TIME: 202311090558
Unit Type and Rh: 5100
Unit Type and Rh: 5100
Unit Type and Rh: 5100

## 2022-06-04 LAB — TYPE AND SCREEN
ABO/RH(D): O POS
Antibody Screen: NEGATIVE
Unit division: 0
Unit division: 0
Unit division: 0

## 2022-06-04 MED ORDER — FUROSEMIDE 20 MG PO TABS
20.0000 mg | ORAL_TABLET | Freq: Two times a day (BID) | ORAL | Status: DC
  Administered 2022-06-04 – 2022-06-05 (×2): 20 mg via ORAL
  Filled 2022-06-04 (×2): qty 1

## 2022-06-04 MED ORDER — METHADONE HCL 10 MG/ML PO CONC
45.0000 mg | Freq: Every day | ORAL | 0 refills | Status: DC
  Filled 2022-06-04: qty 9, 2d supply, fill #0

## 2022-06-04 MED ORDER — METHADONE HCL 5 MG PO TABS
45.0000 mg | ORAL_TABLET | Freq: Once | ORAL | 0 refills | Status: AC
  Filled 2022-06-04: qty 9, 1d supply, fill #0

## 2022-06-04 MED ORDER — METHADONE HCL 10 MG/ML PO CONC
45.0000 mg | Freq: Every day | ORAL | Status: DC
  Administered 2022-06-04 – 2022-06-05 (×2): 45 mg via ORAL
  Filled 2022-06-04 (×2): qty 5

## 2022-06-04 MED ORDER — OXYCODONE-ACETAMINOPHEN 5-325 MG PO TABS
2.0000 | ORAL_TABLET | ORAL | 0 refills | Status: AC | PRN
  Filled 2022-06-04: qty 30, 3d supply, fill #0

## 2022-06-04 MED ORDER — IBUPROFEN 100 MG/5ML PO SUSP
800.0000 mg | Freq: Three times a day (TID) | ORAL | Status: DC
  Filled 2022-06-04: qty 40

## 2022-06-04 MED ORDER — HYDROXYZINE HCL 25 MG PO TABS
25.0000 mg | ORAL_TABLET | Freq: Four times a day (QID) | ORAL | 0 refills | Status: AC | PRN
  Filled 2022-06-04: qty 30, 8d supply, fill #0

## 2022-06-04 MED ORDER — ONDANSETRON 4 MG PO TBDP
4.0000 mg | ORAL_TABLET | Freq: Four times a day (QID) | ORAL | 0 refills | Status: AC | PRN
  Filled 2022-06-04: qty 18, 5d supply, fill #0

## 2022-06-04 MED ORDER — IBUPROFEN 100 MG/5ML PO SUSP
800.0000 mg | Freq: Three times a day (TID) | ORAL | 0 refills | Status: DC
  Filled 2022-06-04: qty 480, 4d supply, fill #0

## 2022-06-04 MED ORDER — FUROSEMIDE 20 MG PO TABS
20.0000 mg | ORAL_TABLET | Freq: Two times a day (BID) | ORAL | 0 refills | Status: AC
  Filled 2022-06-04: qty 10, 5d supply, fill #0

## 2022-06-04 MED ORDER — IBUPROFEN 600 MG PO TABS
600.0000 mg | ORAL_TABLET | Freq: Four times a day (QID) | ORAL | Status: DC
  Administered 2022-06-04 – 2022-06-05 (×3): 600 mg via ORAL
  Filled 2022-06-04 (×4): qty 1

## 2022-06-04 MED ORDER — IBUPROFEN 100 MG/5ML PO SUSP
800.0000 mg | Freq: Three times a day (TID) | ORAL | 0 refills | Status: AC
  Filled 2022-06-04: qty 480, 4d supply, fill #0

## 2022-06-04 NOTE — Plan of Care (Signed)

## 2022-06-04 NOTE — Lactation Note (Addendum)
This note was copied from a baby's chart. Lactation Consultation Note  Breastfeeding/ providing maternal milk is contraindicated at this time because of active maternal substance misuse and no maternal substance misuse treatment in progress. [facility] team is available to consult on maternal health issues related to onset of lactation and avoiding/treating engorgement prn. Otherwise, [facility] services are complete at this time.   [place] consulted with RN on [place] who confirms that maternal patient is uninterested in pumping and pump has not been set up. RN is aware of [facility] services for maternal health-related lactation needs during admission.    Patient Name: April Boyd [id number] Date: 06/04/2022   Age:49 hours   Consult Status Consult Status: Complete    Gwynne Edinger 06/04/2022, 9:18 AM

## 2022-06-04 NOTE — Progress Notes (Signed)
POD2 s/p 1LTCS due to breech presentation d/t PROM.   Subjective: no complaints, up ad lib, voiding, and tolerating PO. Feels like she is going through withdrawal. Last dose of Methadone was on 11/8.   Objective: Blood pressure 120/69, pulse 74, temperature 98 F (36.7 C), temperature source Oral, resp. rate (!) 22, SpO2 95 %, unknown if currently breastfeeding.  Physical Exam:  General: alert, cooperative, and no distress Lochia: appropriate Uterine Fundus: firm Incision: healing well DVT Evaluation: No significant calf/ankle edema.  Recent Labs    06/02/22 2120 06/03/22 1201  HGB 6.3* 10.0*  HCT 19.2* 30.4*    Assessment/Plan: Postpartum/Postop - Pain is well controlled and she is meeting all postop goals - Contraception: BTS - form signed yesterday. Nexplanon until that time. Will insert prior to d/c.  - MOF: Formula and breast - Rh status: Positive - Rubella status: Immune - Dispo: Today or tomorrow depending on response to methadone.  - Consults: Lactation, SW  Acute blood loss anemia due to postpartum hemorrhage - Post 3 units. HgB Is appropriate.  Polysubstance abuse - Resumed methadone after confirming dose through [place] of 45 mg. Will check on her this afternoon for potential d/c if feeling better.  - Urine tox positive for opiates, Bzs, Amphetamines  No prenatal care - SW consulted - PNL: O+/-, RI, NRx4  Neonatal - In NICU - Circumcision: Does desire circ. Did not review consent yet today as baby is still in the NICU.    LOS: 2 days   Radene Gunning 06/04/2022, 11:52 AM

## 2022-06-04 NOTE — Progress Notes (Addendum)
CSW met with MOB at MOB's bedside at MOB's request.  MOB made CSW aware that a CPS worker visited with MOB on yesterday (06/03/22) around 7:30pm.  MOB was unable to recall the workers name.  However MOB communicated that the worker informed MOB that there are barrier to infant d/c with MOB when infant is medically ready.  CSW will follow-up with CPS to determine who the CPS case is assigned to. CSW encouraged MOB to keep in contact with CSW post MOB's discharge.  CSW provided MOB with NICU and CSW contact information.  CSW agreed to follow-up with MOB on Monday when the CPS office re-opens.  CSW updated RN.  CSW spoke with after hours CPS worker Charles Keys.  Mr. Keys communicated that he will provide CSW with assigned worker after checking his system.  Mr. Keys agreed to call CSW after confirming information.   3:38pm: Mr. Keys called CSW and reported there are barriers to d/c and MOB.  Per Mr. King the assigned CPS worker is Ronnette Sumler [phone].  CSW will continue to offer resources and supports to family while infant remains in NICU.    Kandie Keiper Boyd-Gilyard, MSW, LCSW Clinical Social Work [phone] 

## 2022-06-04 NOTE — Clinical Social Work Maternal (Signed)
CLINICAL SOCIAL WORK MATERNAL/CHILD NOTE  Patient Details  Name: April Boyd MRN: [id number] Date of Birth: [birth year]  Date:  06/04/2022  Clinical Social Worker Initiating Note:  Laurey Arrow Date/Time: Initiated:  06/03/22/1155     Child's Name:  Helmut Muster   Biological Parents:  Mother (MOB declined to provide any information about FOB.)   Need for Interpreter:  None   Reason for Referral:  Current Substance Use/Substance Use During Pregnancy  , Homelessness, Late or No Prenatal Care     Address:  [place] [address]    Phone number:  [phone] (home)     Additional phone number:   Household Members/Support Persons (HM/SP):       HM/SP Name Relationship DOB or Age  HM/SP -1        HM/SP -2        HM/SP -3        HM/SP -4        HM/SP -5        HM/SP -6        HM/SP -7        HM/SP -8          Natural Supports (not living in the home):  Other (Comment) (MOB was unable to identify any supports.)   Professional Supports: None   Employment: Unemployed   Type of Work:     Education:  [place] arranged:    Museum/gallery curator Resources:  [employer]   Other Resources:  [employer], Physicist, medical     Cultural/Religious Considerations Which May Impact Care:  None identified  Strengths:  Pediatrician chosen   Psychotropic Medications:         Pediatrician:    [place] area  Pediatrician List:   [employer] for [place]      Pediatrician Fax Number:    Risk Factors/Current Problems:  Substance Use  , Basic Needs     Cognitive State:  Distractible  , Alert  , Linear Thinking     Mood/Affect:  Comfortable  , Interested  , Anxious     CSW Assessment: CSW met with MOB at her bedside in room 117.  When CSW arrived, MOB was standing.  CSW suggested MOB to have a seat while CSW was present to  complete clinical assessment; MOB declined. CSW introduced self and explained CSW role and need to complete assessment.  MOB was understanding and stated, "I am familiar with this process."  MOB  was hesitant to answer CSW's questions and repeatedly stated, "I'm not going to give you any information to use against me."  CSW asked about MOB's housing and MOB reported that she resides in [place] with friends.  Per MOB her oldest daughter Danahi Reddish (06/20/2018) resides with her father, Regino Bellow.   CSW asked about MOB's SA hx and MOB reported that she is currently in active addiction.  Per MOB she last used Heroin on 06/02/2022.  MOB reported that [facility] [place] however she has not been consistent with receiving her treatment. When CSW assessed for barriers, MOB was not able to verbalize any barriers. CSW mae MOB aware that CSW will need to make a [facility] CPS report due to no [facility] and MOB's active substance use; MOB was understanding and communicated she  is familiar with CPS investigation process. A report was made to CPS intake worker Malachy Chamber. At this time there are barriers to infant discharging to [facility] when infant is medically ready.  MOB shared she does not have all essential items to care for infant post discharge.  MOB reported having a bassinet however is in need of a car seat and all other essential items. CSW offered SA resources and MOB declined. MOB is aware that infant's UDS is negative and CSW will in continue to monitor infant's CDS and will report results to CPS.   MOB reported feeling well informed by NICU team and she denied having any questions or concerns. MOB also denied barriers to visiting with infant post MOB's discharge.   CSW will continue to offer resources and supports to family while infant remains in NICU.    CSW Plan/Description:  Psychosocial Support and Ongoing Assessment of Needs, Sudden Infant Death Syndrome  (SIDS) Education, Perinatal Mood and Anxiety Disorder (PMADs) Education, Neonatal Abstinence Syndrome (NAS) Education, Other Patient/Family Education, [place], Other Information/Referral to [employer], Child Copy Report  , CSW Awaiting CPS Disposition Plan, CSW Will Continue to Monitor Umbilical Cord Tissue Drug Screen Results and Make Report if Warranted   Laurey Arrow, MSW, LCSW Clinical Social Work [phone]   Dimple Nanas, LCSW 06/04/2022, 12:00 PM

## 2022-06-05 DIAGNOSIS — Z30017 Encounter for initial prescription of implantable subdermal contraceptive: Secondary | ICD-10-CM

## 2022-06-05 MED ORDER — LIDOCAINE HCL 1 % IJ SOLN
0.0000 mL | Freq: Once | INTRAMUSCULAR | Status: DC | PRN
  Filled 2022-06-05: qty 20

## 2022-06-05 MED ORDER — ETONOGESTREL 68 MG ~~LOC~~ IMPL
68.0000 mg | DRUG_IMPLANT | Freq: Once | SUBCUTANEOUS | Status: AC
  Administered 2022-06-05: 68 mg via SUBCUTANEOUS
  Filled 2022-06-05 (×2): qty 1

## 2022-06-05 NOTE — Procedures (Signed)
GYNECOLOGY PROCEDURE NOTE  April Boyd is a 31 y.o. G2P1002 for Nexplanon insertion.   Nexplanon Insertion Procedure Patient identified, informed consent performed, consent signed.   Patient does understand that irregular bleeding is a very common side effect of this medication. She was advised to have backup contraception for one week after placement. Appropriate time out taken.    Patient's left arm was prepped and draped in the usual sterile fashion. The ruler used to measure and mark insertion area.  Patient was prepped with alcohol swab and then injected with 3 ml of 1% lidocaine.  She was prepped with betadine, Nexplanon removed from packaging,  Device confirmed in needle, then inserted full length of needle and withdrawn per handbook instructions. Nexplanon was able to palpated in the patient's arm; patient palpated the insert herself. There was minimal blood loss.  Patient insertion site covered with guaze and a pressure bandage to reduce any bruising.    The patient tolerated the procedure well and was given post procedure instructions.   The patient tolerated the procedure well and was given post procedure instructions.  She was advised to have backup contraception for one week.     Radene Gunning, MD, [place] for [facility], [place]

## 2022-06-05 NOTE — Discharge Summary (Signed)
Postpartum Discharge Summary  Date of Service updated    Patient Name: April Boyd DOB: 07/20/1991 MRN: [id number]  Date of admission: 06/02/2022 Delivery date:06/02/2022  Delivering provider: Chancy Milroy  Date of discharge: 06/05/2022  Admitting diagnosis: Breech presentation [O32.1XX0] Status post cesarean section [Z98.891] Intrauterine pregnancy: Unknown     Secondary diagnosis:  Principal Problem:   Breech presentation Active Problems:   Status post cesarean section  Additional problems: History of substance abuse, placenta abruption, acute blood loss anemia    Discharge diagnosis: Term Pregnancy Delivered                                              Post partum procedures:blood transfusion Augmentation: N/A Complications: Placental Abruption  Hospital course: Onset of Labor With Unplanned C/S   31 y.o. yo G2P1002 at approximately 38 weeks was admitted in [place] on 06/02/2022. Patient had a labor course significant for abruption and breech position. The patient went for cesarean section due to [facility] and concern for abruption . Delivery details as follows: Membrane Rupture Time/Date: 7:58 PM ,06/02/2022   Delivery Method:C-Section, Low Transverse  Details of operation can be found in separate operative note. Patient had a postpartum course that was uncomplicated. In the operating room, due to her abruption, she had an intra-op hgb which showed hgb of 6.3 and she was given 3 units of prbc. Her post-transfusion hgb rose appropriately.  She was given her confirmed dose of Methadone of 45 mg while she was in patient and she was well controlled on this. She was tearful at discharge about the baby, NICU and DHHS and requested visit with outpatient counselor. Ambulatory referral sent to [facility] with [place] at [facility].  She is ambulating,tolerating a regular diet, passing flatus, and urinating well.  Patient is discharged home in stable condition 06/05/22.  Newborn  Data: Birth date:06/02/2022  Birth time:7:58 PM  Gender:Female  Living status:Living  Apgars:3 ,5  Weight:2570 g   Magnesium Sulfate received: No BMZ received: No Rhophylac:N/A MMR:N/A T-DaP: Offered and recommended postpartum Flu: Offered and recommended postpartum Transfusion:Yes - 3 units PRBC  Physical exam  Vitals:   06/04/22 1933 06/05/22 0015 06/05/22 0338 06/05/22 0859  BP: 110/73 108/65 119/76 129/80  Pulse: 75 72 61 79  Resp: _0 Temp: 97.8 F (36.6 C) (!) 97.5 F (36.4 C) 97.9 F (36.6 C) 98.9 F (37.2 C)  TempSrc: Oral Oral Oral Oral  SpO2: 100% 100% 99% 100%   General: alert, cooperative, and no distress Lochia: appropriate Uterine Fundus: firm Incision: Healing well with no significant drainage DVT Evaluation: No cords or calf tenderness. Calf/Ankle edema is present Labs: Lab Results  Component Value Date   WBC 18.9 (H) 06/03/2022   HGB 10.0 (L) 06/03/2022   HCT 30.4 (L) 06/03/2022   MCV 85.4 06/03/2022   PLT 220 06/03/2022       No data to display         Edinburgh Score:    06/03/2022   12:45 PM  Edinburgh Postnatal Depression Scale Screening Tool  I have been able to laugh and see the funny side of things. 0  I have looked forward with enjoyment to things. 0  I have blamed myself unnecessarily when things went wrong. 2  I have been anxious or worried for no good reason. 0  I  have felt scared or panicky for no good reason. 0  Things have been getting on top of me. 3  I have been so unhappy that I have had difficulty sleeping. 2  I have felt sad or miserable. 2  I have been so unhappy that I have been crying. 2  The thought of harming myself has occurred to me. 0  Edinburgh Postnatal Depression Scale Total 11     After visit meds:  Allergies as of 06/05/2022   No Known Allergies      Medication List     STOP taking these medications    ibuprofen 600 MG tablet Commonly known as: ADVIL Replaced by: Childrens  Ibuprofen 100 MG/5ML suspension   metroNIDAZOLE 500 MG tablet Commonly known as: Flagyl   PRENATAL VITAMIN PO       TAKE these medications    Childrens Ibuprofen 100 MG/5ML suspension Generic drug: ibuprofen Take 40 mLs (800 mg total) by mouth every 8 (eight) hours. Replaces: ibuprofen 600 MG tablet   furosemide 20 MG tablet Commonly known as: LASIX Take 1 tablet (20 mg total) by mouth 2 (two) times daily for 5 days.   hydrOXYzine 25 MG tablet Commonly known as: ATARAX Take 1 tablet (25 mg total) by mouth every 6 (six) hours as needed for anxiety.   methadone 5 MG tablet Commonly known as: DOLOPHINE Take 9 tablets (45 mg total) by mouth once for 1 dose.   ondansetron 4 MG disintegrating tablet Commonly known as: ZOFRAN-ODT Dissolve 1 tablet (4 mg total) by mouth every 6 (six) hours as needed for nausea or vomiting.   oxyCODONE-acetaminophen 5-325 MG tablet Commonly known as: PERCOCET/ROXICET Take 2 tablets by mouth every 4 (four) hours as needed for severe pain ((when tolerating fluids)).               Discharge Care Instructions  (From admission, onward)           Start     Ordered   06/05/22 0000  Discharge wound care:       Comments: If dressing is present, remove after 7 days from surgery   06/05/22 1025   06/05/22 0000  Discharge wound care:       Comments: If dressing is present, remove after 7 days from surgery   06/05/22 1034             Discharge home in stable condition Infant Feeding: Bottle Infant Disposition:NICU Discharge instruction: per After Visit Summary and Postpartum booklet. Activity: Advance as tolerated. Pelvic rest for 6 weeks.  Diet: routine diet Future Appointments: Future Appointments  Date Time Provider [place]  06/10/2022  9:20 AM [facility] NURSE [facility]  07/02/2022 10:35 AM Radene Gunning, MD [facility]   Follow up Visit:  [place] for [place] at [facility] Follow up in 1 week(s).   Specialty: Obstetrics and Gynecology Why: Incision check Contact information: [address] [place] [address] [phone]                 Please schedule this patient for a In person postpartum visit in 4 weeks with the following provider: MD. Additional Postpartum F/U:Incision check 1 week  High risk pregnancy complicated by:  opioid use disorder, polysubstance abuse, no prenatal care Delivery mode:  C-Section, Low Transverse  Anticipated Birth Control:  Nexplanon but desires tubal ligation. Papers signed inpatient and will be scanned in.    06/05/2022  Radene Gunning, MD

## 2022-06-07 ENCOUNTER — Telehealth: Payer: Self-pay | Admitting: Clinical

## 2022-06-07 NOTE — Telephone Encounter (Signed)
Attempt to schedule, per referral; female answered pt number, said she's at her mothers and gave number [phone] as [name]'s mother's number. Call to [phone], mother answered and Left HIPPA-compliant message to call back [name] at  [phone].

## 2022-06-08 LAB — SURGICAL PATHOLOGY

## 2022-06-10 ENCOUNTER — Telehealth: Payer: Self-pay | Admitting: *Deleted

## 2022-06-10 ENCOUNTER — Encounter: Payer: Self-pay | Admitting: Obstetrics and Gynecology

## 2022-06-10 ENCOUNTER — Ambulatory Visit: Payer: Medicaid Other

## 2022-06-10 DIAGNOSIS — Z3009 Encounter for other general counseling and advice on contraception: Secondary | ICD-10-CM | POA: Insufficient documentation

## 2022-06-10 NOTE — Telephone Encounter (Signed)
[place] wound check appointment. I called her preferred home phone and a female answered and said she did not live there anymore. He gave me the mobile number that I had on file. I called her mobile number and heard a message " mailbox is full and cannot accept messges". April Boyd

## 2022-06-11 ENCOUNTER — Telehealth (HOSPITAL_COMMUNITY): Payer: Self-pay | Admitting: *Deleted

## 2022-06-11 NOTE — Telephone Encounter (Signed)
Left phone voicemail message.  Odis Hollingshead, RN 06-11-2022 at 12;39pm

## 2022-07-02 ENCOUNTER — Ambulatory Visit: Payer: Self-pay | Admitting: Obstetrics and Gynecology

## 2022-07-02 DIAGNOSIS — Z98891 History of uterine scar from previous surgery: Secondary | ICD-10-CM

## 2022-07-16 ENCOUNTER — Telehealth: Payer: Self-pay | Admitting: Clinical

## 2022-07-16 NOTE — Telephone Encounter (Signed)
Attempt call regarding referral; "wrong number"

## 2022-08-19 ENCOUNTER — Telehealth: Payer: Self-pay | Admitting: Clinical

## 2022-08-19 NOTE — Telephone Encounter (Signed)
Attempt call regarding referral; Left HIPPA-compliant message to call back [name] from [employer] for [employer] at [facility] at  [phone] [facility] office).
